# Patient Record
Sex: Male | Born: 1990 | Race: Black or African American | Hispanic: No | Marital: Single | Smoking: Never smoker
Health system: Southern US, Community
[De-identification: ages and names within clinical notes are randomized; demographics above are authoritative.]

## PROBLEM LIST (undated history)

## (undated) HISTORY — PX: JOINT REPLACEMENT: SHX530

---

## 2013-02-02 ENCOUNTER — Emergency Department (INDEPENDENT_AMBULATORY_CARE_PROVIDER_SITE_OTHER)
Admission: EM | Admit: 2013-02-02 | Discharge: 2013-02-02 | Disposition: A | Discharge status: Home / Self Care | Payer: Self-pay | Source: Home / Self Care | Attending: Emergency Medicine | Admitting: Emergency Medicine

## 2013-02-02 ENCOUNTER — Emergency Department (HOSPITAL_COMMUNITY)
Admission: EM | Admit: 2013-02-02 | Discharge: 2013-02-02 | Disposition: A | Discharge status: Home / Self Care | Payer: Self-pay | Attending: Emergency Medicine | Admitting: Emergency Medicine

## 2013-02-02 ENCOUNTER — Encounter (HOSPITAL_COMMUNITY): Payer: Self-pay | Admitting: *Deleted

## 2013-02-02 DIAGNOSIS — F17200 Nicotine dependence, unspecified, uncomplicated: Secondary | ICD-10-CM | POA: Insufficient documentation

## 2013-02-02 DIAGNOSIS — J029 Acute pharyngitis, unspecified: Secondary | ICD-10-CM | POA: Insufficient documentation

## 2013-02-02 DIAGNOSIS — R197 Diarrhea, unspecified: Secondary | ICD-10-CM | POA: Insufficient documentation

## 2013-02-02 DIAGNOSIS — IMO0001 Reserved for inherently not codable concepts without codable children: Secondary | ICD-10-CM | POA: Insufficient documentation

## 2013-02-02 DIAGNOSIS — M2550 Pain in unspecified joint: Secondary | ICD-10-CM

## 2013-02-02 DIAGNOSIS — B9789 Other viral agents as the cause of diseases classified elsewhere: Secondary | ICD-10-CM | POA: Insufficient documentation

## 2013-02-02 DIAGNOSIS — R61 Generalized hyperhidrosis: Secondary | ICD-10-CM | POA: Insufficient documentation

## 2013-02-02 DIAGNOSIS — R52 Pain, unspecified: Secondary | ICD-10-CM | POA: Insufficient documentation

## 2013-02-02 DIAGNOSIS — B349 Viral infection, unspecified: Secondary | ICD-10-CM

## 2013-02-02 DIAGNOSIS — R509 Fever, unspecified: Secondary | ICD-10-CM | POA: Insufficient documentation

## 2013-02-02 LAB — CBC WITH DIFFERENTIAL/PLATELET
Basophils Absolute: 0 10*3/uL (ref 0.0–0.1)
Basophils Relative: 0 % (ref 0–1)
Hemoglobin: 12.8 g/dL — ABNORMAL LOW (ref 13.0–17.0)
MCHC: 34.7 g/dL (ref 30.0–36.0)
Neutro Abs: 11.7 10*3/uL — ABNORMAL HIGH (ref 1.7–7.7)
Neutrophils Relative %: 87 % — ABNORMAL HIGH (ref 43–77)
RDW: 13.8 % (ref 11.5–15.5)
WBC: 13.4 10*3/uL — ABNORMAL HIGH (ref 4.0–10.5)

## 2013-02-02 LAB — URINE MICROSCOPIC-ADD ON

## 2013-02-02 LAB — POCT I-STAT, CHEM 8
Chloride: 99 mEq/L (ref 96–112)
HCT: 42 % (ref 39.0–52.0)
Potassium: 4.1 mEq/L (ref 3.5–5.1)

## 2013-02-02 LAB — URINALYSIS, ROUTINE W REFLEX MICROSCOPIC
Nitrite: NEGATIVE
Specific Gravity, Urine: 1.037 — ABNORMAL HIGH (ref 1.005–1.030)
pH: 5.5 (ref 5.0–8.0)

## 2013-02-02 MED ORDER — IBUPROFEN 800 MG PO TABS
800.0000 mg | ORAL_TABLET | Freq: Once | ORAL | Status: AC
  Administered 2013-02-02: 800 mg via ORAL

## 2013-02-02 MED ORDER — IBUPROFEN 800 MG PO TABS
ORAL_TABLET | ORAL | Status: AC
  Filled 2013-02-02: qty 1

## 2013-02-02 MED ORDER — IBUPROFEN 800 MG PO TABS: 800.0000 mg | ORAL_TABLET | Freq: Once | ORAL | Status: DC

## 2013-02-02 NOTE — ED Notes (Signed)
Pt  Reports    He  Has  fever  Body  Aches       Weakness   X  4  Days     He    Reports  Legs  Feels  Weak     He  Reports  Sorethroat as  Well     He  Is  Awake  And  Alert     His  Friend  Says this  Is  Not  Typical  Of  Him and  Something is  wrong

## 2013-02-02 NOTE — ED Notes (Signed)
Pt alert and mentating appropriately upon d/c. Pt given d/c teaching and follow up care instructions. NAD noted upon d/c. Pt verbalizes understanding and has no further questions upon d/c. Pt ambulatory upon d/c. Pt leaving with family.

## 2013-02-02 NOTE — ED Provider Notes (Addendum)
History    CSN: [id number] Arrival date & time 02/02/13  1139  First MD Initiated Contact with Patient 02/02/13 1233     Chief Complaint  Patient presents with  . Generalized Body Aches   (Consider location/radiation/quality/duration/timing/severity/associated sxs/prior Treatment) HPI Comments: Patient presents urgent care describing for the last 4 days he has been feeling "sore all over". Patient describes having fevers at home and chills. Initially had a sore throat. He is wondering did the day before he got sick he was actually walking in the rain for almost an hour got drenched in sweat.  Patient denies any respiratory symptoms such as cough, congested nose or shortness of breath.  At this point patient is describing that he is unable to walk because of pain on all of his joints including ankles knees his hip area helpful wrist. Does not feel good been having headaches as well.   Patient denies any paresthesias to his upper lower extremity such as tingling or numbness sensation.  The history is provided by the patient.   History reviewed. No pertinent past medical history. History reviewed. No pertinent past surgical history. No family history on file. History  Substance Use Topics  . Smoking status: Current Every Day Smoker  . Smokeless tobacco: Not on file  . Alcohol Use: Yes    Review of Systems  Constitutional: Positive for fever, chills, activity change, appetite change and fatigue. Negative for unexpected weight change.  HENT: Negative for ear pain, congestion, rhinorrhea, neck pain and neck stiffness.   Respiratory: Negative for shortness of breath.   Musculoskeletal: Positive for myalgias, back pain, arthralgias and gait problem. Negative for joint swelling.  Skin: Negative for color change, pallor, rash and wound.  Neurological: Negative for dizziness, weakness and numbness.  Hematological: Negative for adenopathy. Does not bruise/bleed easily.    Allergies   Review of patient's allergies indicates no known allergies.  Home Medications   Current Outpatient Rx  Name  Route  Sig  Dispense  Refill  . Ibuprofen (MOTRIN PO)   Oral   Take by mouth.          BP 119/43  Pulse 96  Temp(Src) 102.6 F (39.2 C) (Oral)  Resp 19  SpO2 99% Physical Exam  Nursing note reviewed. Constitutional: Vital signs are normal. He appears well-developed and well-nourished.  Non-toxic appearance. He does not have a sickly appearance. He appears ill. No distress.  HENT:  Head: Normocephalic.  Eyes: Conjunctivae are normal. Right eye exhibits no discharge. No scleral icterus.  Neck: Neck supple. No JVD present.  Pulmonary/Chest: Effort normal and breath sounds normal.  Abdominal: Soft. Normal appearance. There is no hepatosplenomegaly, splenomegaly or hepatomegaly. There is no tenderness. There is no rigidity, no rebound, no guarding, no CVA tenderness, no tenderness at McBurney's point and negative Murphy's sign. No hernia. Hernia confirmed negative in the ventral area.  Musculoskeletal: He exhibits no edema and no tenderness.  Neurological: He is alert. He displays no tremor. No cranial nerve deficit or sensory deficit. He exhibits normal muscle tone.  Muscular strength lower extreme ties seem to be conserved and within normal-      Skin: No rash noted. No erythema.    ED Course  Procedures (including critical care time) Labs Reviewed  POCT I-STAT, CHEM 8 - Abnormal; Notable for the following:    Glucose, Bld 111 (*)    All other components within normal limits  CBC WITH DIFFERENTIAL   No results found. No diagnosis found.  MDM  Polyarthralgia- febrile syndrome.  Non- ambulatory patient- presents to urgent care unable to walk secondary to pain and discomfort. Patient- sick appearance consistently febrile. We initiated workup with an i-STAT, CBC, patient denies any respiratory symptoms to suggest a active respiratory process.   Patient will  be transferred to the emergency department for further monitoring, observation and evaluation.  Among the differential, includes Guillain Barre Syndrome-  Electrolytes within normal values- normal renal function CBC pending Jimmie Molly, MD 02/02/13 1302 liters  Jimmie Molly, MD 02/02/13 [phone]

## 2013-02-02 NOTE — ED Notes (Signed)
Pt was sent down from urgent care with joint pain and fever from [facility].  Pt was medicated for fever at [facility].  Pt has sorethroat and they swabbed throat at [facility]

## 2013-02-02 NOTE — ED Provider Notes (Signed)
History    CSN: [id number] Arrival date & time 02/02/13  1320  First MD Initiated Contact with Patient 02/02/13 1330     Chief Complaint  Patient presents with  . Fever   (Consider location/radiation/quality/duration/timing/severity/associated sxs/prior Treatment) HPI Comments: Patient a 22 year old male with no significant past medical history who presents for myalgias times or days. Patient states he woke up 4 days ago feeling sore in all of his joints including his shoulders, elbows, lower back and hips, and bilateral knees and ankles. Symptom aggravated when ambulating or with movement. Patient denies taking his temperature at home, but states that he has been having diaphoresis quite frequently; waking up drenched in sweat. He further admits to associated sore throat and watery, nonbloody diarrhea. He denies eye pain/redness, ear discharge, inability to swallow, drooling, cough, SOB, CP, abdominal pain, N/V, urinary symptoms, numbness/tingling, and an inability to ambulate. Denies recent tick bites or rashes.  The history is provided by the patient. No language interpreter was used.   History reviewed. No pertinent past medical history. History reviewed. No pertinent past surgical history. No family history on file. History  Substance Use Topics  . Smoking status: Current Every Day Smoker  . Smokeless tobacco: Not on file  . Alcohol Use: Yes    Review of Systems  Constitutional: Positive for diaphoresis.  Respiratory: Negative for cough and shortness of breath.   Cardiovascular: Negative for chest pain.  Gastrointestinal: Positive for diarrhea. Negative for abdominal pain and blood in stool.  Genitourinary: Negative for dysuria and hematuria.  Musculoskeletal: Positive for myalgias. Negative for joint swelling.  Skin: Negative for rash.  Neurological: Negative for syncope and numbness.  All other systems reviewed and are negative.    Allergies  Review of patient's  allergies indicates no known allergies.  Home Medications   Current Outpatient Rx  Name  Route  Sig  Dispense  Refill  . Diphenhydramine-APAP, sleep, (TYLENOL PM EXTRA STRENGTH PO)   Oral   Take 1 tablet by mouth at bedtime as needed. For sleep         . ibuprofen (ADVIL,MOTRIN) 200 MG tablet   Oral   Take 200 mg by mouth every 6 (six) hours as needed for pain.          BP 132/64  Pulse 95  Temp(Src) 98.5 F (36.9 C) (Oral)  Resp 22  SpO2 96% Physical Exam  Nursing note and vitals reviewed. Constitutional: He is oriented to person, place, and time. He appears well-developed and well-nourished. No distress.  HENT:  Head: Normocephalic and atraumatic.  Mouth/Throat: Oropharynx is clear and moist. No oropharyngeal exudate.  Negative Brudzinski's sign; no nuchal rigidity  Eyes: Conjunctivae and EOM are normal. Pupils are equal, round, and reactive to light. No scleral icterus.  Neck: Normal range of motion.  Cardiovascular: Normal rate, regular rhythm and normal heart sounds.   Pulmonary/Chest: Effort normal and breath sounds normal. No respiratory distress. He has no wheezes. He has no rales.  Abdominal: Soft. He exhibits no distension. There is no tenderness. There is no rebound and no guarding.  Musculoskeletal: Normal range of motion.  Neurological: He is alert and oriented to person, place, and time. He has normal strength. No sensory deficit. He exhibits normal muscle tone. GCS eye subscore is 4. GCS verbal subscore is 5. GCS motor subscore is 6.  Reflex Scores:      Patellar reflexes are 2+ on the right side and 2+ on the left side.  Achilles reflexes are 2+ on the right side. Ambulatory with normal gait.  Skin: Skin is warm and dry. No rash noted. He is not diaphoretic. No erythema. No pallor.  Psychiatric: He has a normal mood and affect. His behavior is normal.    ED Course  Procedures (including critical care time) Labs Reviewed  URINALYSIS, ROUTINE W  REFLEX MICROSCOPIC - Abnormal; Notable for the following:    Color, Urine AMBER (*)    APPearance CLOUDY (*)    Specific Gravity, Urine 1.037 (*)    Hgb urine dipstick SMALL (*)    Bilirubin Urine SMALL (*)    Ketones, ur 15 (*)    Protein, ur 100 (*)    Leukocytes, UA TRACE (*)    All other components within normal limits  URINE MICROSCOPIC-ADD ON - Abnormal; Notable for the following:    Squamous Epithelial / LPF FEW (*)    All other components within normal limits  CULTURE, GROUP A STREP   No results found.  1. Fever   2. Viral illness    MDM  Patient is a 22 year old male who presents from urgent care for fevers and generalized body aches. Chemistry completed at urgent care without any significant findings; electrolytes without imbalance and kidney function preserved. CBC also done at this time which was significant for leukocytosis of 13.4. H&H stable. Rapid strep screen negative. Patient sent over for further workup and monitoring; concern for Guillian Barre syndrome expressed in urgent care note. I, personally, have very low suspicion for GB as patient with no loss of sensation in his b/l extremities or decreased patellar or achilles reflexes; 5/5 strength against resistance of b/l extremities and patient ambulatory in ED without difficulty or assistance. Will check UA for infection. Doubt pneumonia or cardiopulmonary etiology as patient without tachycardia, tachypnea, dyspnea, or hypoxia. Lungs CTAB.  UA negative for evidence of infection. Patient is well and nontoxic appearing, in NAD. Temp down to 98.5 in ED after receiving ibuprofen at urgent care. Suspect that symptoms are due to a viral illness and do not believe further work up is necessary at this time. Patient stable for d/c with PCP follow up; resource guide given. Ibuprofen or tylenol advised for fever control. Return precautions provided and patient agreeable to plan.     Antony Madura, PA-C 02/02/13 1528

## 2013-02-02 NOTE — ED Notes (Signed)
Pt states "they gave me that ibuprofen at [facility] and I feel a lot better." NAD noted. Pt alert and mentating appropriately.

## 2013-02-03 LAB — POCT RAPID STREP A: Streptococcus, Group A Screen (Direct): NEGATIVE

## 2013-02-03 NOTE — ED Provider Notes (Signed)
Medical screening examination/treatment/procedure(s) were conducted as a shared visit with non-physician practitioner(s) and myself.  I personally evaluated the patient during the encounter Pt c/o fever and sore throat, also c/o body aches.  Sore throat now improving. Pt looks well, tolerating po.   Suzi Roots, MD 02/03/13 [phone]

## 2013-02-05 LAB — CULTURE, GROUP A STREP

## 2013-05-31 ENCOUNTER — Emergency Department (HOSPITAL_COMMUNITY)
Admission: EM | Admit: 2013-05-31 | Discharge: 2013-05-31 | Disposition: A | Discharge status: Home / Self Care | Payer: Self-pay | Attending: Emergency Medicine | Admitting: Emergency Medicine

## 2013-05-31 ENCOUNTER — Encounter (HOSPITAL_COMMUNITY): Payer: Self-pay | Admitting: Emergency Medicine

## 2013-05-31 DIAGNOSIS — K047 Periapical abscess without sinus: Secondary | ICD-10-CM | POA: Insufficient documentation

## 2013-05-31 DIAGNOSIS — F17200 Nicotine dependence, unspecified, uncomplicated: Secondary | ICD-10-CM | POA: Insufficient documentation

## 2013-05-31 MED ORDER — OXYCODONE-ACETAMINOPHEN 5-325 MG PO TABS
2.0000 | ORAL_TABLET | Freq: Once | ORAL | Status: DC
  Filled 2013-05-31: qty 2

## 2013-05-31 MED ORDER — IBUPROFEN 400 MG PO TABS
800.0000 mg | ORAL_TABLET | Freq: Once | ORAL | Status: AC
  Administered 2013-05-31: 800 mg via ORAL
  Filled 2013-05-31: qty 2

## 2013-05-31 MED ORDER — PENICILLIN V POTASSIUM 500 MG PO TABS: 500.0000 mg | ORAL_TABLET | Freq: Four times a day (QID) | ORAL | Status: AC

## 2013-05-31 MED ORDER — OXYCODONE-ACETAMINOPHEN 5-325 MG PO TABS: 2.0000 | ORAL_TABLET | ORAL | Status: DC | PRN

## 2013-05-31 NOTE — ED Provider Notes (Signed)
Medical screening examination/treatment/procedure(s) were performed by non-physician practitioner and as supervising physician I was immediately available for consultation/collaboration.  Nichlos Kunzler L Haedyn Breau, MD 05/31/13 1629 

## 2013-05-31 NOTE — ED Provider Notes (Signed)
CSN: [id number]     Arrival date & time 05/31/13  1045 History  This chart was scribed for non-physician practitioner  Irish Elders, FNP, working with Flint Melter, MD, by Yevette Edwards, ED Scribe. This patient was seen in room TR06C/TR06C and the patient's care was started at 12:17 PM.   First MD Initiated Contact with Patient 05/31/13 1217     Chief Complaint  Patient presents with  . Dental Pain    The history is provided by the patient. No language interpreter was used.   HPI Comments: Paul Bender is a 22 y.o. male who presents to the Emergency Department complaining of left-sided, lower dental pain which began yesterday evening and is associated with swollen gums and a subjective fever. He reports increased sensitivity to cold. The pt denies any tenderness to his jaw. The pt has used Tylenol extra strength to mitigate the pain, but without resolution. He is a current smoker.   History reviewed. No pertinent past medical history. History reviewed. No pertinent past surgical history. History reviewed. No pertinent family history. History  Substance Use Topics  . Smoking status: Current Every Day Smoker  . Smokeless tobacco: Not on file  . Alcohol Use: Yes    Review of Systems  Constitutional: Positive for fever.  HENT: Positive for dental problem.        Swelling to gums.     Allergies  Review of patient's allergies indicates no known allergies.  Home Medications   Current Outpatient Rx  Name  Route  Sig  Dispense  Refill  . acetaminophen (TYLENOL) 500 MG tablet   Oral   Take 500 mg by mouth every 6 (six) hours as needed for mild pain or moderate pain.          Triage Vitals: BP 131/85  Pulse 82  Temp(Src) 99 F (37.2 C) (Oral)  Resp 16  Ht 6\' 1"  (1.854 m)  Wt 171 lb (77.565 kg)  BMI 22.57 kg/m2  SpO2 97%  Physical Exam  Nursing note and vitals reviewed. Constitutional: He is oriented to person, place, and time. He appears well-developed and  well-nourished. No distress.  HENT:  Head: Normocephalic and atraumatic.  Mouth/Throat:    No external swelling to jaw or neck.   Eyes: EOM are normal.  Neck: Neck supple. No tracheal deviation present.  Cardiovascular: Normal rate, regular rhythm and normal heart sounds.   No murmur heard. Pulmonary/Chest: Effort normal and breath sounds normal. No respiratory distress. He has no wheezes.  Musculoskeletal: Normal range of motion.  Neurological: He is alert and oriented to person, place, and time.  Skin: Skin is warm and dry.  Psychiatric: He has a normal mood and affect. His behavior is normal.    ED Course  Procedures (including critical care time)  DIAGNOSTIC STUDIES: Oxygen Saturation is 97% on room air, normal by my interpretation.    COORDINATION OF CARE:  12:21 PM- Discussed treatment plan with patient which includes an antibiotic and pain medication, and the patient agreed to the plan. Also informed the pt he needed to follow-up with a dentist.   Labs Review Labs Reviewed - No data to display Imaging Review No results found.  EKG Interpretation   None       MDM   1. Tooth abscess    Tooth broken, abscess. Pain control and Pen VK given and follow-up with dentist. No fever, chills or neck swelling. Eating and drinking fine.   I personally performed the services  described in this documentation, which was scribed in my presence. The recorded information has been reviewed and is accurate.     Irish Elders, NP 05/31/13 1550

## 2013-05-31 NOTE — ED Notes (Signed)
Pt stated he drove to ED.  PA to can cancel oxycodone-acetaminophen order and order IBU.

## 2013-05-31 NOTE — ED Notes (Signed)
States he has a cavity on bottom L tooth and it started to hurt last night

## 2019-12-03 ENCOUNTER — Encounter (HOSPITAL_COMMUNITY)
Admission: EM | Disposition: A | Discharge status: Other Acute Inpatient Hospital | Payer: Self-pay | Source: Home / Self Care

## 2019-12-03 ENCOUNTER — Emergency Department (HOSPITAL_COMMUNITY): Payer: No Typology Code available for payment source

## 2019-12-03 ENCOUNTER — Other Ambulatory Visit (HOSPITAL_COMMUNITY): Payer: Self-pay

## 2019-12-03 ENCOUNTER — Inpatient Hospital Stay (HOSPITAL_COMMUNITY): Payer: No Typology Code available for payment source | Admitting: Anesthesiology

## 2019-12-03 ENCOUNTER — Inpatient Hospital Stay (HOSPITAL_COMMUNITY): Payer: No Typology Code available for payment source

## 2019-12-03 ENCOUNTER — Inpatient Hospital Stay (HOSPITAL_COMMUNITY)
Admission: EM | Admit: 2019-12-03 | Discharge: 2020-01-09 | DRG: 957 | Disposition: A | Discharge status: Other Acute Inpatient Hospital | Payer: No Typology Code available for payment source | Attending: General Surgery | Admitting: General Surgery

## 2019-12-03 ENCOUNTER — Emergency Department (HOSPITAL_COMMUNITY): Payer: No Typology Code available for payment source | Admitting: Certified Registered Nurse Anesthetist

## 2019-12-03 ENCOUNTER — Other Ambulatory Visit: Payer: Self-pay

## 2019-12-03 ENCOUNTER — Encounter (HOSPITAL_COMMUNITY): Payer: Self-pay | Admitting: Emergency Medicine

## 2019-12-03 DIAGNOSIS — S0292XA Unspecified fracture of facial bones, initial encounter for closed fracture: Secondary | ICD-10-CM

## 2019-12-03 DIAGNOSIS — I10 Essential (primary) hypertension: Secondary | ICD-10-CM | POA: Diagnosis not present

## 2019-12-03 DIAGNOSIS — Y9301 Activity, walking, marching and hiking: Secondary | ICD-10-CM | POA: Diagnosis present

## 2019-12-03 DIAGNOSIS — S42131A Displaced fracture of coracoid process, right shoulder, initial encounter for closed fracture: Secondary | ICD-10-CM | POA: Diagnosis present

## 2019-12-03 DIAGNOSIS — S0231XA Fracture of orbital floor, right side, initial encounter for closed fracture: Secondary | ICD-10-CM | POA: Diagnosis present

## 2019-12-03 DIAGNOSIS — S02831A Fracture of medial orbital wall, right side, initial encounter for closed fracture: Secondary | ICD-10-CM | POA: Diagnosis present

## 2019-12-03 DIAGNOSIS — S32401A Unspecified fracture of right acetabulum, initial encounter for closed fracture: Secondary | ICD-10-CM

## 2019-12-03 DIAGNOSIS — S02651A Fracture of angle of right mandible, initial encounter for closed fracture: Secondary | ICD-10-CM | POA: Diagnosis present

## 2019-12-03 DIAGNOSIS — Z9911 Dependence on respirator [ventilator] status: Secondary | ICD-10-CM | POA: Diagnosis not present

## 2019-12-03 DIAGNOSIS — J9690 Respiratory failure, unspecified, unspecified whether with hypoxia or hypercapnia: Secondary | ICD-10-CM

## 2019-12-03 DIAGNOSIS — S82832C Other fracture of upper and lower end of left fibula, initial encounter for open fracture type IIIA, IIIB, or IIIC: Secondary | ICD-10-CM | POA: Diagnosis present

## 2019-12-03 DIAGNOSIS — S022XXA Fracture of nasal bones, initial encounter for closed fracture: Secondary | ICD-10-CM | POA: Diagnosis present

## 2019-12-03 DIAGNOSIS — S02641A Fracture of ramus of right mandible, initial encounter for closed fracture: Secondary | ICD-10-CM | POA: Diagnosis present

## 2019-12-03 DIAGNOSIS — T148XXA Other injury of unspecified body region, initial encounter: Secondary | ICD-10-CM

## 2019-12-03 DIAGNOSIS — S0281XA Fracture of other specified skull and facial bones, right side, initial encounter for closed fracture: Secondary | ICD-10-CM

## 2019-12-03 DIAGNOSIS — S358X8A Other specified injury of other blood vessels at abdomen, lower back and pelvis level, initial encounter: Secondary | ICD-10-CM | POA: Diagnosis present

## 2019-12-03 DIAGNOSIS — S32461A Displaced associated transverse-posterior fracture of right acetabulum, initial encounter for closed fracture: Secondary | ICD-10-CM

## 2019-12-03 DIAGNOSIS — Y92410 Unspecified street and highway as the place of occurrence of the external cause: Secondary | ICD-10-CM | POA: Diagnosis not present

## 2019-12-03 DIAGNOSIS — Z20822 Contact with and (suspected) exposure to covid-19: Secondary | ICD-10-CM | POA: Diagnosis present

## 2019-12-03 DIAGNOSIS — R509 Fever, unspecified: Secondary | ICD-10-CM | POA: Diagnosis not present

## 2019-12-03 DIAGNOSIS — Q899 Congenital malformation, unspecified: Secondary | ICD-10-CM

## 2019-12-03 DIAGNOSIS — S82102C Unspecified fracture of upper end of left tibia, initial encounter for open fracture type IIIA, IIIB, or IIIC: Secondary | ICD-10-CM | POA: Diagnosis present

## 2019-12-03 DIAGNOSIS — J9601 Acute respiratory failure with hypoxia: Secondary | ICD-10-CM | POA: Diagnosis present

## 2019-12-03 DIAGNOSIS — S32421A Displaced fracture of posterior wall of right acetabulum, initial encounter for closed fracture: Secondary | ICD-10-CM | POA: Diagnosis present

## 2019-12-03 DIAGNOSIS — S32591A Other specified fracture of right pubis, initial encounter for closed fracture: Secondary | ICD-10-CM | POA: Diagnosis present

## 2019-12-03 DIAGNOSIS — E876 Hypokalemia: Secondary | ICD-10-CM | POA: Diagnosis not present

## 2019-12-03 DIAGNOSIS — D62 Acute posthemorrhagic anemia: Secondary | ICD-10-CM | POA: Diagnosis not present

## 2019-12-03 DIAGNOSIS — F23 Brief psychotic disorder: Secondary | ICD-10-CM | POA: Diagnosis not present

## 2019-12-03 DIAGNOSIS — Z4659 Encounter for fitting and adjustment of other gastrointestinal appliance and device: Secondary | ICD-10-CM

## 2019-12-03 DIAGNOSIS — S62232A Other displaced fracture of base of first metacarpal bone, left hand, initial encounter for closed fracture: Secondary | ICD-10-CM | POA: Diagnosis present

## 2019-12-03 DIAGNOSIS — T84218A Breakdown (mechanical) of internal fixation device of other bones, initial encounter: Secondary | ICD-10-CM | POA: Diagnosis not present

## 2019-12-03 DIAGNOSIS — Z781 Physical restraint status: Secondary | ICD-10-CM

## 2019-12-03 DIAGNOSIS — I4581 Long QT syndrome: Secondary | ICD-10-CM | POA: Diagnosis not present

## 2019-12-03 DIAGNOSIS — D696 Thrombocytopenia, unspecified: Secondary | ICD-10-CM | POA: Diagnosis not present

## 2019-12-03 DIAGNOSIS — F332 Major depressive disorder, recurrent severe without psychotic features: Secondary | ICD-10-CM | POA: Diagnosis present

## 2019-12-03 DIAGNOSIS — R000 Tachycardia, unspecified: Secondary | ICD-10-CM | POA: Diagnosis not present

## 2019-12-03 DIAGNOSIS — I9742 Intraoperative hemorrhage and hematoma of a circulatory system organ or structure complicating other procedure: Secondary | ICD-10-CM | POA: Diagnosis not present

## 2019-12-03 DIAGNOSIS — R609 Edema, unspecified: Secondary | ICD-10-CM | POA: Diagnosis not present

## 2019-12-03 DIAGNOSIS — S82209A Unspecified fracture of shaft of unspecified tibia, initial encounter for closed fracture: Secondary | ICD-10-CM

## 2019-12-03 DIAGNOSIS — S0101XA Laceration without foreign body of scalp, initial encounter: Secondary | ICD-10-CM | POA: Diagnosis present

## 2019-12-03 DIAGNOSIS — R937 Abnormal findings on diagnostic imaging of other parts of musculoskeletal system: Secondary | ICD-10-CM | POA: Diagnosis present

## 2019-12-03 DIAGNOSIS — S32471A Displaced fracture of medial wall of right acetabulum, initial encounter for closed fracture: Secondary | ICD-10-CM | POA: Diagnosis present

## 2019-12-03 DIAGNOSIS — S32441A Displaced fracture of posterior column [ilioischial] of right acetabulum, initial encounter for closed fracture: Secondary | ICD-10-CM | POA: Diagnosis present

## 2019-12-03 DIAGNOSIS — Y828 Other medical devices associated with adverse incidents: Secondary | ICD-10-CM | POA: Diagnosis not present

## 2019-12-03 DIAGNOSIS — Z9119 Patient's noncompliance with other medical treatment and regimen: Secondary | ICD-10-CM

## 2019-12-03 DIAGNOSIS — R600 Localized edema: Secondary | ICD-10-CM | POA: Diagnosis not present

## 2019-12-03 DIAGNOSIS — I9589 Other hypotension: Secondary | ICD-10-CM | POA: Diagnosis present

## 2019-12-03 DIAGNOSIS — S3730XA Unspecified injury of urethra, initial encounter: Secondary | ICD-10-CM

## 2019-12-03 DIAGNOSIS — S82262C Displaced segmental fracture of shaft of left tibia, initial encounter for open fracture type IIIA, IIIB, or IIIC: Secondary | ICD-10-CM

## 2019-12-03 DIAGNOSIS — M79645 Pain in left finger(s): Secondary | ICD-10-CM

## 2019-12-03 DIAGNOSIS — N5089 Other specified disorders of the male genital organs: Secondary | ICD-10-CM | POA: Diagnosis present

## 2019-12-03 DIAGNOSIS — S86222A Laceration of muscle(s) and tendon(s) of anterior muscle group at lower leg level, left leg, initial encounter: Secondary | ICD-10-CM | POA: Diagnosis present

## 2019-12-03 DIAGNOSIS — S82252C Displaced comminuted fracture of shaft of left tibia, initial encounter for open fracture type IIIA, IIIB, or IIIC: Secondary | ICD-10-CM

## 2019-12-03 DIAGNOSIS — R110 Nausea: Secondary | ICD-10-CM | POA: Diagnosis not present

## 2019-12-03 DIAGNOSIS — T1490XA Injury, unspecified, initial encounter: Secondary | ICD-10-CM

## 2019-12-03 DIAGNOSIS — S300XXA Contusion of lower back and pelvis, initial encounter: Secondary | ICD-10-CM | POA: Diagnosis present

## 2019-12-03 DIAGNOSIS — S329XXA Fracture of unspecified parts of lumbosacral spine and pelvis, initial encounter for closed fracture: Secondary | ICD-10-CM

## 2019-12-03 DIAGNOSIS — Z419 Encounter for procedure for purposes other than remedying health state, unspecified: Secondary | ICD-10-CM

## 2019-12-03 DIAGNOSIS — R319 Hematuria, unspecified: Secondary | ICD-10-CM

## 2019-12-03 DIAGNOSIS — R7402 Elevation of levels of lactic acid dehydrogenase (LDH): Secondary | ICD-10-CM | POA: Diagnosis present

## 2019-12-03 DIAGNOSIS — S0081XA Abrasion of other part of head, initial encounter: Secondary | ICD-10-CM | POA: Diagnosis present

## 2019-12-03 DIAGNOSIS — S82452C Displaced comminuted fracture of shaft of left fibula, initial encounter for open fracture type IIIA, IIIB, or IIIC: Secondary | ICD-10-CM

## 2019-12-03 DIAGNOSIS — T50995A Adverse effect of other drugs, medicaments and biological substances, initial encounter: Secondary | ICD-10-CM | POA: Diagnosis not present

## 2019-12-03 DIAGNOSIS — S32409A Unspecified fracture of unspecified acetabulum, initial encounter for closed fracture: Secondary | ICD-10-CM

## 2019-12-03 DIAGNOSIS — S60512A Abrasion of left hand, initial encounter: Secondary | ICD-10-CM | POA: Diagnosis present

## 2019-12-03 DIAGNOSIS — S60511A Abrasion of right hand, initial encounter: Secondary | ICD-10-CM | POA: Diagnosis present

## 2019-12-03 HISTORY — PX: IR ANGIOGRAM SELECTIVE EACH ADDITIONAL VESSEL: IMG667

## 2019-12-03 HISTORY — PX: IR US GUIDE VASC ACCESS LEFT: IMG2389

## 2019-12-03 HISTORY — PX: IR EMBO ART  VEN HEMORR LYMPH EXTRAV  INC GUIDE ROADMAPPING: IMG5450

## 2019-12-03 HISTORY — PX: IR ANGIOGRAM PELVIS SELECTIVE OR SUPRASELECTIVE: IMG661

## 2019-12-03 HISTORY — PX: TIBIA IM NAIL INSERTION: SHX2516

## 2019-12-03 HISTORY — PX: I & D EXTREMITY: SHX5045

## 2019-12-03 HISTORY — PX: RADIOLOGY WITH ANESTHESIA: SHX6223

## 2019-12-03 HISTORY — PX: ORIF MANDIBULAR FRACTURE: SHX2127

## 2019-12-03 HISTORY — PX: INSERTION OF TRACTION PIN: SHX6560

## 2019-12-03 HISTORY — PX: SCALP LACERATION REPAIR: SHX6089

## 2019-12-03 LAB — URINALYSIS, ROUTINE W REFLEX MICROSCOPIC
Bacteria, UA: NONE SEEN
Bilirubin Urine: NEGATIVE
Glucose, UA: NEGATIVE mg/dL
Ketones, ur: NEGATIVE mg/dL
Leukocytes,Ua: NEGATIVE
Nitrite: NEGATIVE
Protein, ur: 30 mg/dL — AB
RBC / HPF: >50 RBC/hpf — ABNORMAL HIGH (ref 0–5)
Specific Gravity, Urine: >1.046 — ABNORMAL HIGH (ref 1.005–1.030)
pH: 7 (ref 5.0–8.0)

## 2019-12-03 LAB — POCT I-STAT 7, (LYTES, BLD GAS, ICA,H+H)
Acid-Base Excess: 1 mmol/L (ref 0.0–2.0)
Acid-base deficit: 1 mmol/L (ref 0.0–2.0)
Acid-base deficit: 1 mmol/L (ref 0.0–2.0)
Acid-base deficit: 2 mmol/L (ref 0.0–2.0)
Bicarbonate: 22.7 mmol/L (ref 20.0–28.0)
Bicarbonate: 23 mmol/L (ref 20.0–28.0)
Bicarbonate: 23.1 mmol/L (ref 20.0–28.0)
Bicarbonate: 25.8 mmol/L (ref 20.0–28.0)
Calcium, Ion: 1.14 mmol/L — ABNORMAL LOW (ref 1.15–1.40)
Calcium, Ion: 1.15 mmol/L (ref 1.15–1.40)
Calcium, Ion: 1.16 mmol/L (ref 1.15–1.40)
Calcium, Ion: 1.07 mmol/L — ABNORMAL LOW (ref 1.15–1.40)
HCT: 26 % — ABNORMAL LOW (ref 39.0–52.0)
HCT: 23 % — ABNORMAL LOW (ref 39.0–52.0)
HCT: 26 % — ABNORMAL LOW (ref 39.0–52.0)
HCT: 24 % — ABNORMAL LOW (ref 39.0–52.0)
Hemoglobin: 8.8 g/dL — ABNORMAL LOW (ref 13.0–17.0)
Hemoglobin: 7.8 g/dL — ABNORMAL LOW (ref 13.0–17.0)
Hemoglobin: 8.8 g/dL — ABNORMAL LOW (ref 13.0–17.0)
Hemoglobin: 8.2 g/dL — ABNORMAL LOW (ref 13.0–17.0)
O2 Saturation: 100 %
O2 Saturation: 100 %
O2 Saturation: 100 %
O2 Saturation: 100 %
Patient temperature: 35.1
Patient temperature: 34.5
Potassium: 3.2 mmol/L — ABNORMAL LOW (ref 3.5–5.1)
Potassium: 3 mmol/L — ABNORMAL LOW (ref 3.5–5.1)
Potassium: 3.4 mmol/L — ABNORMAL LOW (ref 3.5–5.1)
Potassium: 3.6 mmol/L (ref 3.5–5.1)
Sodium: 136 mmol/L (ref 135–145)
Sodium: 136 mmol/L (ref 135–145)
Sodium: 138 mmol/L (ref 135–145)
Sodium: 138 mmol/L (ref 135–145)
TCO2: 24 mmol/L (ref 22–32)
TCO2: 24 mmol/L (ref 22–32)
TCO2: 24 mmol/L (ref 22–32)
TCO2: 27 mmol/L (ref 22–32)
pCO2 arterial: 31.8 mmHg — ABNORMAL LOW (ref 32.0–48.0)
pCO2 arterial: 32.9 mmHg (ref 32.0–48.0)
pCO2 arterial: 38.3 mmHg (ref 32.0–48.0)
pCO2 arterial: 40.3 mmHg (ref 32.0–48.0)
pH, Arterial: 7.461 — ABNORMAL HIGH (ref 7.350–7.450)
pH, Arterial: 7.444 (ref 7.350–7.450)
pH, Arterial: 7.378 (ref 7.350–7.450)
pH, Arterial: 7.414 (ref 7.350–7.450)
pO2, Arterial: 432 mmHg — ABNORMAL HIGH (ref 83.0–108.0)
pO2, Arterial: 312 mmHg — ABNORMAL HIGH (ref 83.0–108.0)
pO2, Arterial: 276 mmHg — ABNORMAL HIGH (ref 83.0–108.0)
pO2, Arterial: 572 mmHg — ABNORMAL HIGH (ref 83.0–108.0)

## 2019-12-03 LAB — CBC
HCT: 33.1 % — ABNORMAL LOW (ref 39.0–52.0)
HCT: 25.6 % — ABNORMAL LOW (ref 39.0–52.0)
Hemoglobin: 10.9 g/dL — ABNORMAL LOW (ref 13.0–17.0)
Hemoglobin: 8.8 g/dL — ABNORMAL LOW (ref 13.0–17.0)
MCH: 31.7 pg (ref 26.0–34.0)
MCH: 30.6 pg (ref 26.0–34.0)
MCHC: 32.9 g/dL (ref 30.0–36.0)
MCHC: 34.4 g/dL (ref 30.0–36.0)
MCV: 96.2 fL (ref 80.0–100.0)
MCV: 88.9 fL (ref 80.0–100.0)
Platelets: 132 10*3/uL — ABNORMAL LOW (ref 150–400)
Platelets: 65 10*3/uL — ABNORMAL LOW (ref 150–400)
RBC: 3.44 MIL/uL — ABNORMAL LOW (ref 4.22–5.81)
RBC: 2.88 MIL/uL — ABNORMAL LOW (ref 4.22–5.81)
RDW: 14.2 % (ref 11.5–15.5)
RDW: 15.3 % (ref 11.5–15.5)
WBC: 8 10*3/uL (ref 4.0–10.5)
WBC: 7.5 10*3/uL (ref 4.0–10.5)
nRBC: 0 % (ref 0.0–0.2)
nRBC: 0 % (ref 0.0–0.2)

## 2019-12-03 LAB — TRAUMA TEG PANEL
CFF Max Amplitude: 12.8 mm — ABNORMAL LOW (ref 15–32)
Citrated Kaolin (R): 2 min — ABNORMAL LOW (ref 4.6–9.1)
Citrated Rapid TEG (MA): 46.8 mm — ABNORMAL LOW (ref 52–70)
Lysis at 30 Minutes: 0 % (ref 0.0–2.6)

## 2019-12-03 LAB — COMPREHENSIVE METABOLIC PANEL
ALT: 37 U/L (ref 0–44)
ALT: 33 U/L (ref 0–44)
AST: 89 U/L — ABNORMAL HIGH (ref 15–41)
AST: 97 U/L — ABNORMAL HIGH (ref 15–41)
Albumin: 3.2 g/dL — ABNORMAL LOW (ref 3.5–5.0)
Albumin: 2.5 g/dL — ABNORMAL LOW (ref 3.5–5.0)
Alkaline Phosphatase: 45 U/L (ref 38–126)
Alkaline Phosphatase: 26 U/L — ABNORMAL LOW (ref 38–126)
Anion gap: 11 (ref 5–15)
Anion gap: 8 (ref 5–15)
BUN: 12 mg/dL (ref 6–20)
BUN: 12 mg/dL (ref 6–20)
CO2: 21 mmol/L — ABNORMAL LOW (ref 22–32)
CO2: 21 mmol/L — ABNORMAL LOW (ref 22–32)
Calcium: 8.3 mg/dL — ABNORMAL LOW (ref 8.9–10.3)
Calcium: 7.5 mg/dL — ABNORMAL LOW (ref 8.9–10.3)
Chloride: 105 mmol/L (ref 98–111)
Chloride: 108 mmol/L (ref 98–111)
Creatinine, Ser: 1.09 mg/dL (ref 0.61–1.24)
Creatinine, Ser: 0.96 mg/dL (ref 0.61–1.24)
GFR calc Af Amer: >60 mL/min (ref >60)
GFR calc Af Amer: >60 mL/min (ref >60)
GFR calc non Af Amer: >60 mL/min (ref >60)
GFR calc non Af Amer: >60 mL/min (ref >60)
Glucose, Bld: 152 mg/dL — ABNORMAL HIGH (ref 70–99)
Glucose, Bld: 139 mg/dL — ABNORMAL HIGH (ref 70–99)
Potassium: 3.7 mmol/L (ref 3.5–5.1)
Potassium: 3.7 mmol/L (ref 3.5–5.1)
Sodium: 137 mmol/L (ref 135–145)
Sodium: 137 mmol/L (ref 135–145)
Total Bilirubin: 1.1 mg/dL (ref 0.3–1.2)
Total Bilirubin: 1.3 mg/dL — ABNORMAL HIGH (ref 0.3–1.2)
Total Protein: 6 g/dL — ABNORMAL LOW (ref 6.5–8.1)
Total Protein: 4.2 g/dL — ABNORMAL LOW (ref 6.5–8.1)

## 2019-12-03 LAB — RAPID URINE DRUG SCREEN, HOSP PERFORMED
Amphetamines: NOT DETECTED
Barbiturates: NOT DETECTED
Benzodiazepines: NOT DETECTED
Cocaine: NOT DETECTED
Opiates: NOT DETECTED
Tetrahydrocannabinol: POSITIVE — AB

## 2019-12-03 LAB — LACTIC ACID, PLASMA
Lactic Acid, Venous: 2.4 mmol/L (ref 0.5–1.9)
Lactic Acid, Venous: 1.1 mmol/L (ref 0.5–1.9)

## 2019-12-03 LAB — PROTIME-INR
INR: 1 (ref 0.8–1.2)
Prothrombin Time: 13 seconds (ref 11.4–15.2)

## 2019-12-03 LAB — I-STAT CHEM 8, ED
BUN: 14 mg/dL (ref 6–20)
Calcium, Ion: 1.08 mmol/L — ABNORMAL LOW (ref 1.15–1.40)
Chloride: 103 mmol/L (ref 98–111)
Creatinine, Ser: 1 mg/dL (ref 0.61–1.24)
Glucose, Bld: 140 mg/dL — ABNORMAL HIGH (ref 70–99)
HCT: 33 % — ABNORMAL LOW (ref 39.0–52.0)
Hemoglobin: 11.2 g/dL — ABNORMAL LOW (ref 13.0–17.0)
Potassium: 3.6 mmol/L (ref 3.5–5.1)
Sodium: 137 mmol/L (ref 135–145)
TCO2: 22 mmol/L (ref 22–32)

## 2019-12-03 LAB — ETHANOL: Alcohol, Ethyl (B): <10 mg/dL (ref <10)

## 2019-12-03 LAB — HIV ANTIBODY (ROUTINE TESTING W REFLEX): HIV Screen 4th Generation wRfx: NONREACTIVE

## 2019-12-03 LAB — PREPARE RBC (CROSSMATCH)

## 2019-12-03 LAB — ABO/RH: ABO/RH(D): A POS

## 2019-12-03 LAB — SARS CORONAVIRUS 2 BY RT PCR (HOSPITAL ORDER, PERFORMED IN ~~LOC~~ HOSPITAL LAB): SARS Coronavirus 2: NEGATIVE

## 2019-12-03 SURGERY — IR WITH ANESTHESIA
Anesthesia: General

## 2019-12-03 SURGERY — INSERTION, TRACTION PIN
Anesthesia: General | Site: Leg Lower | Laterality: Right

## 2019-12-03 MED ORDER — FENTANYL CITRATE (PF) 100 MCG/2ML IJ SOLN
50.0000 ug | INTRAMUSCULAR | Status: DC | PRN
  Administered 2019-12-04: 50 ug via INTRAVENOUS

## 2019-12-03 MED ORDER — MIDAZOLAM HCL 2 MG/2ML IJ SOLN
INTRAMUSCULAR | Status: AC
  Filled 2019-12-03: qty 2

## 2019-12-03 MED ORDER — FENTANYL 2500MCG IN NS 250ML (10MCG/ML) PREMIX INFUSION
25.0000 ug/h | INTRAVENOUS | Status: DC
  Administered 2019-12-03: 100 ug/h via INTRAVENOUS
  Administered 2019-12-03: 200 ug/h via INTRAVENOUS
  Administered 2019-12-04: 300 ug/h via INTRAVENOUS
  Administered 2019-12-04: 200 ug/h via INTRAVENOUS
  Administered 2019-12-05: 325 ug/h via INTRAVENOUS
  Administered 2019-12-05: 350 ug/h via INTRAVENOUS
  Filled 2019-12-03 (×5): qty 250

## 2019-12-03 MED ORDER — BACITRACIN 500 UNIT/GM EX OINT
TOPICAL_OINTMENT | CUTANEOUS | Status: DC | PRN
  Administered 2019-12-03: 1 via TOPICAL

## 2019-12-03 MED ORDER — ONDANSETRON HCL 4 MG PO TABS: 4.0000 mg | ORAL_TABLET | Freq: Four times a day (QID) | ORAL | Status: DC | PRN

## 2019-12-03 MED ORDER — MORPHINE SULFATE (PF) 4 MG/ML IV SOLN
4.0000 mg | INTRAVENOUS | Status: DC | PRN
  Administered 2019-12-06 – 2019-12-27 (×6): 4 mg via INTRAVENOUS
  Filled 2019-12-03 (×8): qty 1

## 2019-12-03 MED ORDER — FENTANYL BOLUS VIA INFUSION
50.0000 ug | INTRAVENOUS | Status: DC | PRN
  Filled 2019-12-03: qty 50

## 2019-12-03 MED ORDER — MIDAZOLAM HCL 2 MG/2ML IJ SOLN: 2.0000 mg | INTRAMUSCULAR | Status: DC | PRN

## 2019-12-03 MED ORDER — METOCLOPRAMIDE HCL 5 MG/ML IJ SOLN: 5.0000 mg | Freq: Three times a day (TID) | INTRAMUSCULAR | Status: DC | PRN

## 2019-12-03 MED ORDER — TOBRAMYCIN SULFATE 1.2 G IJ SOLR
INTRAMUSCULAR | Status: AC
  Filled 2019-12-03: qty 1.2

## 2019-12-03 MED ORDER — POVIDONE-IODINE 10 % EX SWAB: 2.0000 "application " | Freq: Once | CUTANEOUS | Status: DC

## 2019-12-03 MED ORDER — VANCOMYCIN HCL 1000 MG IV SOLR
INTRAVENOUS | Status: DC | PRN
  Administered 2019-12-03: 1000 mg

## 2019-12-03 MED ORDER — ONDANSETRON HCL 4 MG/2ML IJ SOLN: 4.0000 mg | Freq: Four times a day (QID) | INTRAMUSCULAR | Status: DC | PRN

## 2019-12-03 MED ORDER — ACETAMINOPHEN 500 MG PO TABS
1000.0000 mg | ORAL_TABLET | Freq: Four times a day (QID) | ORAL | Status: DC
  Administered 2019-12-04 – 2019-12-05 (×3): 1000 mg
  Filled 2019-12-03 (×5): qty 2

## 2019-12-03 MED ORDER — CEFAZOLIN SODIUM-DEXTROSE 2-3 GM-%(50ML) IV SOLR
INTRAVENOUS | Status: DC | PRN
  Administered 2019-12-03: 2 g via INTRAVENOUS

## 2019-12-03 MED ORDER — LACTATED RINGERS IV SOLN: INTRAVENOUS | Status: DC

## 2019-12-03 MED ORDER — METOCLOPRAMIDE HCL 5 MG PO TABS
5.0000 mg | ORAL_TABLET | Freq: Three times a day (TID) | ORAL | Status: DC | PRN
  Filled 2019-12-03: qty 2

## 2019-12-03 MED ORDER — PROPOFOL 1000 MG/100ML IV EMUL
0.0000 ug/kg/min | INTRAVENOUS | Status: DC
  Administered 2019-12-03 (×2): 60 ug/kg/min via INTRAVENOUS
  Administered 2019-12-03 – 2019-12-04 (×2): 50 ug/kg/min via INTRAVENOUS
  Administered 2019-12-04: 40 ug/kg/min via INTRAVENOUS
  Administered 2019-12-04: 50 ug/kg/min via INTRAVENOUS
  Administered 2019-12-04: 40 ug/kg/min via INTRAVENOUS
  Administered 2019-12-05: 15 ug/kg/min via INTRAVENOUS
  Filled 2019-12-03 (×6): qty 100

## 2019-12-03 MED ORDER — FENTANYL CITRATE (PF) 100 MCG/2ML IJ SOLN: 50.0000 ug | INTRAMUSCULAR | Status: DC | PRN

## 2019-12-03 MED ORDER — CEFAZOLIN SODIUM-DEXTROSE 1-4 GM/50ML-% IV SOLN
INTRAVENOUS | Status: AC
  Filled 2019-12-03: qty 100

## 2019-12-03 MED ORDER — METHYLENE BLUE 0.5 % INJ SOLN
INTRAVENOUS | Status: AC
  Filled 2019-12-03: qty 10

## 2019-12-03 MED ORDER — FENTANYL CITRATE (PF) 250 MCG/5ML IJ SOLN
INTRAMUSCULAR | Status: AC
  Filled 2019-12-03: qty 5

## 2019-12-03 MED ORDER — KETAMINE HCL 50 MG/5ML IJ SOSY
1.0000 mg/kg | PREFILLED_SYRINGE | Freq: Once | INTRAMUSCULAR | Status: AC
  Administered 2019-12-03: 77 mg via INTRAVENOUS
  Filled 2019-12-03: qty 10

## 2019-12-03 MED ORDER — IOHEXOL 300 MG/ML  SOLN
50.0000 mL | Freq: Once | INTRAMUSCULAR | Status: AC | PRN
  Administered 2019-12-03: 50 mL

## 2019-12-03 MED ORDER — MIDAZOLAM HCL 5 MG/5ML IJ SOLN
INTRAMUSCULAR | Status: DC | PRN
  Administered 2019-12-03: 2 mg via INTRAVENOUS

## 2019-12-03 MED ORDER — ROCURONIUM BROMIDE 10 MG/ML (PF) SYRINGE
PREFILLED_SYRINGE | INTRAVENOUS | Status: DC | PRN
  Administered 2019-12-03 (×2): 50 mg via INTRAVENOUS
  Administered 2019-12-03 (×2): 30 mg via INTRAVENOUS

## 2019-12-03 MED ORDER — GELATIN ABSORBABLE 12-7 MM EX MISC
CUTANEOUS | Status: AC
  Filled 2019-12-03: qty 2

## 2019-12-03 MED ORDER — POTASSIUM CHLORIDE IN NACL 20-0.9 MEQ/L-% IV SOLN
INTRAVENOUS | Status: DC
  Filled 2019-12-03 (×6): qty 1000

## 2019-12-03 MED ORDER — PANTOPRAZOLE SODIUM 40 MG PO TBEC: 40.0000 mg | DELAYED_RELEASE_TABLET | Freq: Every day | ORAL | Status: DC

## 2019-12-03 MED ORDER — CHLORHEXIDINE GLUCONATE 4 % EX LIQD
60.0000 mL | Freq: Once | CUTANEOUS | Status: DC
  Filled 2019-12-03: qty 60

## 2019-12-03 MED ORDER — ALBUMIN HUMAN 5 % IV SOLN: INTRAVENOUS | Status: DC | PRN

## 2019-12-03 MED ORDER — ONDANSETRON HCL 4 MG/2ML IJ SOLN
INTRAMUSCULAR | Status: DC | PRN
  Administered 2019-12-03: 4 mg via INTRAVENOUS

## 2019-12-03 MED ORDER — SODIUM CHLORIDE 0.9 % IV SOLN
INTRAVENOUS | Status: DC | PRN
Start: 2019-12-03 — End: 2019-12-03

## 2019-12-03 MED ORDER — METHOCARBAMOL 1000 MG/10ML IJ SOLN
500.0000 mg | Freq: Four times a day (QID) | INTRAVENOUS | Status: DC | PRN
  Filled 2019-12-03: qty 5

## 2019-12-03 MED ORDER — ROCURONIUM BROMIDE 10 MG/ML (PF) SYRINGE
PREFILLED_SYRINGE | INTRAVENOUS | Status: AC
  Filled 2019-12-03: qty 10

## 2019-12-03 MED ORDER — IOHEXOL 300 MG/ML  SOLN
100.0000 mL | Freq: Once | INTRAMUSCULAR | Status: AC | PRN
  Administered 2019-12-03: 60 mL via INTRA_ARTERIAL

## 2019-12-03 MED ORDER — CEFAZOLIN SODIUM-DEXTROSE 2-4 GM/100ML-% IV SOLN
2.0000 g | INTRAVENOUS | Status: AC
  Administered 2019-12-04 (×2): 2 g via INTRAVENOUS
  Filled 2019-12-03: qty 100

## 2019-12-03 MED ORDER — METHOCARBAMOL 500 MG PO TABS: 500.0000 mg | ORAL_TABLET | Freq: Four times a day (QID) | ORAL | Status: DC | PRN

## 2019-12-03 MED ORDER — POLYETHYLENE GLYCOL 3350 17 G PO PACK: 17.0000 g | PACK | Freq: Every day | ORAL | Status: DC

## 2019-12-03 MED ORDER — METOPROLOL TARTRATE 5 MG/5ML IV SOLN
5.0000 mg | Freq: Four times a day (QID) | INTRAVENOUS | Status: DC | PRN
  Administered 2019-12-08 (×2): 5 mg via INTRAVENOUS
  Filled 2019-12-03 (×2): qty 5

## 2019-12-03 MED ORDER — METHYLENE BLUE 0.5 % INJ SOLN
INTRAVENOUS | Status: DC | PRN
  Administered 2019-12-03: 2 mL

## 2019-12-03 MED ORDER — PHENYLEPHRINE HCL-NACL 10-0.9 MG/250ML-% IV SOLN
INTRAVENOUS | Status: DC | PRN
  Administered 2019-12-03: 100 ug/min via INTRAVENOUS

## 2019-12-03 MED ORDER — VANCOMYCIN HCL 1000 MG IV SOLR
INTRAVENOUS | Status: AC
  Filled 2019-12-03: qty 1000

## 2019-12-03 MED ORDER — TETANUS-DIPHTH-ACELL PERTUSSIS 5-2.5-18.5 LF-MCG/0.5 IM SUSP: 0.5000 mL | Freq: Once | INTRAMUSCULAR | Status: DC

## 2019-12-03 MED ORDER — ROCURONIUM BROMIDE 10 MG/ML (PF) SYRINGE
PREFILLED_SYRINGE | INTRAVENOUS | Status: DC | PRN
  Administered 2019-12-03: 50 mg via INTRAVENOUS

## 2019-12-03 MED ORDER — TOBRAMYCIN SULFATE 1.2 G IJ SOLR
INTRAMUSCULAR | Status: DC | PRN
  Administered 2019-12-03 (×2): 1.2 g

## 2019-12-03 MED ORDER — SODIUM CHLORIDE 0.9 % IV SOLN
2.0000 g | INTRAVENOUS | Status: DC
  Administered 2019-12-03 – 2019-12-04 (×2): 2 g via INTRAVENOUS
  Filled 2019-12-03: qty 2
  Filled 2019-12-03: qty 20
  Filled 2019-12-03: qty 2

## 2019-12-03 MED ORDER — 0.9 % SODIUM CHLORIDE (POUR BTL) OPTIME
TOPICAL | Status: DC | PRN
  Administered 2019-12-03: 1000 mL

## 2019-12-03 MED ORDER — ORAL CARE MOUTH RINSE
15.0000 mL | OROMUCOSAL | Status: DC
  Administered 2019-12-03 – 2019-12-06 (×20): 15 mL via OROMUCOSAL

## 2019-12-03 MED ORDER — DOCUSATE SODIUM 50 MG/5ML PO LIQD
100.0000 mg | Freq: Two times a day (BID) | ORAL | Status: DC
  Administered 2019-12-04: 100 mg
  Filled 2019-12-03 (×5): qty 10

## 2019-12-03 MED ORDER — IOHEXOL 300 MG/ML  SOLN
100.0000 mL | Freq: Once | INTRAMUSCULAR | Status: AC | PRN
  Administered 2019-12-03: 100 mL via INTRAVENOUS

## 2019-12-03 MED ORDER — LACTATED RINGERS IV SOLN
INTRAVENOUS | Status: DC | PRN
Start: 2019-12-03 — End: 2019-12-03

## 2019-12-03 MED ORDER — LACTATED RINGERS IV SOLN
INTRAVENOUS | Status: AC | PRN
  Administered 2019-12-03: 1000 mL via INTRAVENOUS

## 2019-12-03 MED ORDER — KCL IN DEXTROSE-NACL 20-5-0.45 MEQ/L-%-% IV SOLN: INTRAVENOUS | Status: DC

## 2019-12-03 MED ORDER — CHLORHEXIDINE GLUCONATE 0.12% ORAL RINSE (MEDLINE KIT)
15.0000 mL | Freq: Two times a day (BID) | OROMUCOSAL | Status: DC
  Administered 2019-12-03 – 2019-12-05 (×4): 15 mL via OROMUCOSAL

## 2019-12-03 MED ORDER — MIDAZOLAM HCL 5 MG/5ML IJ SOLN
INTRAMUSCULAR | Status: AC | PRN
  Administered 2019-12-03: 2 mg via INTRAVENOUS

## 2019-12-03 MED ORDER — SODIUM CHLORIDE 0.9% FLUSH
10.0000 mL | Freq: Two times a day (BID) | INTRAVENOUS | Status: DC
  Administered 2019-12-03 – 2019-12-09 (×10): 10 mL
  Administered 2019-12-09: 20 mL
  Administered 2019-12-10 – 2020-01-09 (×29): 10 mL

## 2019-12-03 MED ORDER — LIDOCAINE HCL 1 % IJ SOLN
INTRAMUSCULAR | Status: AC
  Filled 2019-12-03: qty 20

## 2019-12-03 MED ORDER — PROPOFOL 500 MG/50ML IV EMUL
INTRAVENOUS | Status: DC | PRN
  Administered 2019-12-03: 25 ug/kg/min via INTRAVENOUS

## 2019-12-03 MED ORDER — OXYCODONE HCL 5 MG PO TABS
5.0000 mg | ORAL_TABLET | ORAL | Status: DC | PRN
Start: 2019-12-03 — End: 2019-12-03

## 2019-12-03 MED ORDER — SODIUM CHLORIDE 0.9% FLUSH: 10.0000 mL | INTRAVENOUS | Status: DC | PRN

## 2019-12-03 MED ORDER — LIDOCAINE HCL 1 % IJ SOLN
INTRAMUSCULAR | Status: DC | PRN
  Administered 2019-12-03: 3 mL
  Administered 2019-12-03: 10 mL

## 2019-12-03 MED ORDER — SODIUM CHLORIDE 0.9 % IV SOLN
10.0000 mL/h | Freq: Once | INTRAVENOUS | Status: AC
  Administered 2019-12-03: 10 mL/h via INTRAVENOUS

## 2019-12-03 MED ORDER — OXYCODONE HCL 5 MG PO TABS
5.0000 mg | ORAL_TABLET | ORAL | Status: DC | PRN
  Filled 2019-12-03: qty 2

## 2019-12-03 MED ORDER — PHENYLEPHRINE HCL-NACL 10-0.9 MG/250ML-% IV SOLN
INTRAVENOUS | Status: DC | PRN
Start: 2019-12-03 — End: 2019-12-03
  Administered 2019-12-03: 25 ug/min via INTRAVENOUS

## 2019-12-03 MED ORDER — CALCIUM CHLORIDE 10 % IV SOLN
INTRAVENOUS | Status: DC | PRN
  Administered 2019-12-03: 300 mg via INTRAVENOUS

## 2019-12-03 MED ORDER — LACTATED RINGERS IV SOLN: INTRAVENOUS | Status: DC | PRN

## 2019-12-03 MED ORDER — POLYETHYLENE GLYCOL 3350 17 G PO PACK: 17.0000 g | PACK | Freq: Every day | ORAL | Status: DC | PRN

## 2019-12-03 MED ORDER — ONDANSETRON 4 MG PO TBDP: 4.0000 mg | ORAL_TABLET | Freq: Four times a day (QID) | ORAL | Status: DC | PRN

## 2019-12-03 MED ORDER — CHLORHEXIDINE GLUCONATE CLOTH 2 % EX PADS
6.0000 | MEDICATED_PAD | Freq: Every day | CUTANEOUS | Status: DC
  Administered 2019-12-03 – 2020-01-09 (×24): 6 via TOPICAL

## 2019-12-03 MED ORDER — PROPOFOL 1000 MG/100ML IV EMUL
INTRAVENOUS | Status: AC
  Filled 2019-12-03: qty 100

## 2019-12-03 MED ORDER — FENTANYL CITRATE (PF) 250 MCG/5ML IJ SOLN
INTRAMUSCULAR | Status: DC | PRN
  Administered 2019-12-03 (×2): 50 ug via INTRAVENOUS
  Administered 2019-12-03: 150 ug via INTRAVENOUS
  Administered 2019-12-03: 100 ug via INTRAVENOUS
  Administered 2019-12-03: 50 ug via INTRAVENOUS
  Administered 2019-12-03: 100 ug via INTRAVENOUS

## 2019-12-03 MED ORDER — ROCURONIUM BROMIDE 50 MG/5ML IV SOLN
INTRAVENOUS | Status: AC | PRN
  Administered 2019-12-03: 100 mg via INTRAVENOUS

## 2019-12-03 MED ORDER — IOHEXOL 300 MG/ML  SOLN
150.0000 mL | Freq: Once | INTRAMUSCULAR | Status: AC | PRN
  Administered 2019-12-03: 20 mL via INTRA_ARTERIAL

## 2019-12-03 MED ORDER — MIDAZOLAM HCL 2 MG/2ML IJ SOLN
INTRAMUSCULAR | Status: AC
  Administered 2019-12-03: 2 mg via INTRAVENOUS
  Filled 2019-12-03: qty 2

## 2019-12-03 MED ORDER — BACITRACIN ZINC 500 UNIT/GM EX OINT
TOPICAL_OINTMENT | CUTANEOUS | Status: AC
  Filled 2019-12-03: qty 28.35

## 2019-12-03 MED ORDER — LIDOCAINE-EPINEPHRINE 1 %-1:100000 IJ SOLN
INTRAMUSCULAR | Status: AC
  Filled 2019-12-03: qty 1

## 2019-12-03 MED ORDER — FENTANYL CITRATE (PF) 100 MCG/2ML IJ SOLN
INTRAMUSCULAR | Status: DC | PRN
  Administered 2019-12-03: 25 ug via INTRAVENOUS
  Administered 2019-12-03: 75 ug via INTRAVENOUS

## 2019-12-03 MED ORDER — PANTOPRAZOLE SODIUM 40 MG IV SOLR
40.0000 mg | Freq: Every day | INTRAVENOUS | Status: DC
  Administered 2019-12-04 – 2019-12-05 (×2): 40 mg via INTRAVENOUS
  Filled 2019-12-03 (×3): qty 40

## 2019-12-03 MED ORDER — PHENYLEPHRINE HCL (PRESSORS) 10 MG/ML IV SOLN
INTRAVENOUS | Status: DC | PRN
Start: 2019-12-03 — End: 2019-12-03
  Administered 2019-12-03: 80 ug via INTRAVENOUS
  Administered 2019-12-03: 40 ug via INTRAVENOUS

## 2019-12-03 MED ORDER — DEXAMETHASONE SODIUM PHOSPHATE 10 MG/ML IJ SOLN
INTRAMUSCULAR | Status: DC | PRN
  Administered 2019-12-03: 8 mg via INTRAVENOUS

## 2019-12-03 SURGICAL SUPPLY — 99 items
BIT DRILL 4.3 CALIBRATED (DRILL) ×5 IMPLANT
BIT DRILL CALIBRTD FREE HND4.3 (BIT) ×10 IMPLANT
BIT DRILL SURG TIB 3.3X152.5 (DRILL) ×5 IMPLANT
BLADE SURG 10 STRL SS (BLADE) ×14 IMPLANT
BLADE SURG 15 STRL LF DISP TIS (BLADE) ×5 IMPLANT
BLADE SURG 15 STRL SS (BLADE) ×2
BNDG COHESIVE 4X5 TAN STRL (GAUZE/BANDAGES/DRESSINGS) ×7 IMPLANT
BNDG ELASTIC 4X5.8 VLCR NS LF (GAUZE/BANDAGES/DRESSINGS) ×7 IMPLANT
BNDG ELASTIC 4X5.8 VLCR STR LF (GAUZE/BANDAGES/DRESSINGS) ×7 IMPLANT
BNDG ELASTIC 6X5.8 VLCR STR LF (GAUZE/BANDAGES/DRESSINGS) ×7 IMPLANT
BNDG GAUZE ELAST 4 BULKY (GAUZE/BANDAGES/DRESSINGS) ×14 IMPLANT
BRUSH SCRUB EZ PLAIN DRY (MISCELLANEOUS) ×14 IMPLANT
CANISTER WOUND CARE 500ML ATS (WOUND CARE) ×7 IMPLANT
CEMENT BONE R 1X40 (Cement) ×7 IMPLANT
CHLORAPREP W/TINT 26 (MISCELLANEOUS) ×7 IMPLANT
CLEANER TIP ELECTROSURG 2X2 (MISCELLANEOUS) ×7 IMPLANT
CLOSURE WOUND 1/2 X4 (GAUZE/BANDAGES/DRESSINGS)
COVER MAYO STAND STRL (DRAPES) ×7 IMPLANT
COVER SURGICAL LIGHT HANDLE (MISCELLANEOUS) ×14 IMPLANT
COVER WAND RF STERILE (DRAPES) ×7 IMPLANT
DRAPE C-ARM 42X72 X-RAY (DRAPES) ×7 IMPLANT
DRAPE C-ARMOR (DRAPES) ×7 IMPLANT
DRAPE HALF SHEET 40X57 (DRAPES) ×21 IMPLANT
DRAPE IMP U-DRAPE 54X76 (DRAPES) ×14 IMPLANT
DRAPE INCISE IOBAN 66X45 STRL (DRAPES) IMPLANT
DRAPE ORTHO SPLIT 77X108 STRL (DRAPES) ×4
DRAPE SURG 17X23 STRL (DRAPES) ×7 IMPLANT
DRAPE SURG ORHT 6 SPLT 77X108 (DRAPES) ×10 IMPLANT
DRAPE U-SHAPE 47X51 STRL (DRAPES) ×7 IMPLANT
DRILL 4.3 CALIBRATED (DRILL) ×7
DRILL CALIBRATED FREE HAND 4.3 (BIT) ×14
DRILL SURG TIB 3.3X152.5 (DRILL) ×7
DRSG ADAPTIC 3X8 NADH LF (GAUZE/BANDAGES/DRESSINGS) ×7 IMPLANT
DRSG MEPITEL 4X7.2 (GAUZE/BANDAGES/DRESSINGS) ×7 IMPLANT
ELECT COATED BLADE 2.86 ST (ELECTRODE) ×7 IMPLANT
ELECT NEEDLE BLADE 2-5/6 (NEEDLE) ×7 IMPLANT
ELECT REM PT RETURN 9FT ADLT (ELECTROSURGICAL) ×7
ELECTRODE REM PT RTRN 9FT ADLT (ELECTROSURGICAL) ×5 IMPLANT
EVACUATOR 1/8 PVC DRAIN (DRAIN) IMPLANT
GAUZE SPONGE 4X4 12PLY STRL (GAUZE/BANDAGES/DRESSINGS) ×7 IMPLANT
GAUZE SPONGE 4X4 12PLY STRL LF (GAUZE/BANDAGES/DRESSINGS) ×7 IMPLANT
GLOVE BIO SURGEON STRL SZ 6.5 (GLOVE) ×18 IMPLANT
GLOVE BIO SURGEON STRL SZ7.5 (GLOVE) ×28 IMPLANT
GLOVE BIO SURGEONS STRL SZ 6.5 (GLOVE) ×3
GLOVE BIOGEL M 7.0 STRL (GLOVE) ×7 IMPLANT
GLOVE BIOGEL PI IND STRL 6.5 (GLOVE) ×5 IMPLANT
GLOVE BIOGEL PI IND STRL 7.5 (GLOVE) ×5 IMPLANT
GLOVE BIOGEL PI INDICATOR 6.5 (GLOVE) ×2
GLOVE BIOGEL PI INDICATOR 7.5 (GLOVE) ×2
GOWN STRL REUS W/ TWL LRG LVL3 (GOWN DISPOSABLE) ×10 IMPLANT
GOWN STRL REUS W/TWL LRG LVL3 (GOWN DISPOSABLE) ×4
GUIDEWIRE HUMERL  BALL TIP 2.4 (WIRE) ×2
GUIDEWIRE HUMERL BALL TIP 2.4 (WIRE) ×5 IMPLANT
HANDPIECE INTERPULSE COAX TIP (DISPOSABLE)
KIT BASIN OR (CUSTOM PROCEDURE TRAY) ×7 IMPLANT
KIT PREVENA INCISION MGT20CM45 (CANNISTER) ×7 IMPLANT
KIT TURNOVER KIT B (KITS) ×7 IMPLANT
MANIFOLD NEPTUNE II (INSTRUMENTS) ×7 IMPLANT
NAIL TIB UNIV 10X400 (Nail) ×7 IMPLANT
NEEDLE 22X1 1/2 (OR ONLY) (NEEDLE) IMPLANT
NS IRRIG 1000ML POUR BTL (IV SOLUTION) ×7 IMPLANT
PACK ORTHO EXTREMITY (CUSTOM PROCEDURE TRAY) ×7 IMPLANT
PACK TOTAL JOINT (CUSTOM PROCEDURE TRAY) ×7 IMPLANT
PAD ARMBOARD 7.5X6 YLW CONV (MISCELLANEOUS) ×14 IMPLANT
PAD CAST 4YDX4 CTTN HI CHSV (CAST SUPPLIES) ×5 IMPLANT
PADDING CAST COTTON 4X4 STRL (CAST SUPPLIES) ×2
PADDING CAST COTTON 6X4 STRL (CAST SUPPLIES) ×7 IMPLANT
PIN GUIDE 3.0 THREADED (PIN) ×7 IMPLANT
SCREW BONE 5.0X35MM CORTICAL (Screw) ×7 IMPLANT
SCREW BONE 5.0X37.5MM CORT Z (Screw) ×14 IMPLANT
SCREW BONE 5.0X57.5MM CORTIC (Nail) ×7 IMPLANT
SCREW CANC FEM FA 5X30 (Screw) ×7 IMPLANT
SCREW CORT FA 4X40 (Screw) ×7 IMPLANT
SCREW CORT FA STD 5X60 (Screw) ×7 IMPLANT
SCREW SD IMF HEX 2.0X7 (Screw) ×14 IMPLANT
SCREW SD IMF HEX 2.0X9 (Screw) ×14 IMPLANT
SET HNDPC FAN SPRY TIP SCT (DISPOSABLE) IMPLANT
SPONGE LAP 18X18 RF (DISPOSABLE) ×7 IMPLANT
STAPLER VISISTAT 35W (STAPLE) ×7 IMPLANT
STRIP CLOSURE SKIN 1/2X4 (GAUZE/BANDAGES/DRESSINGS) IMPLANT
SUT CHROMIC 3 0 SH 27 (SUTURE) ×7 IMPLANT
SUT ETHILON 2 0 FS 18 (SUTURE) ×49 IMPLANT
SUT ETHILON 3 0 PS 1 (SUTURE) ×28 IMPLANT
SUT MNCRL AB 3-0 PS2 18 (SUTURE) ×7 IMPLANT
SUT MON AB 2-0 CT1 36 (SUTURE) ×21 IMPLANT
SUT PDS AB 0 CT 36 (SUTURE) IMPLANT
SUT PROLENE 0 CT (SUTURE) IMPLANT
SUT VIC AB 0 CT1 27 (SUTURE) ×2
SUT VIC AB 0 CT1 27XBRD ANBCTR (SUTURE) ×5 IMPLANT
SUT VIC AB 2-0 CT1 27 (SUTURE)
SUT VIC AB 2-0 CT1 TAPERPNT 27 (SUTURE) IMPLANT
SWAB CULTURE ESWAB REG 1ML (MISCELLANEOUS) IMPLANT
TOWEL GREEN STERILE (TOWEL DISPOSABLE) ×14 IMPLANT
TOWEL GREEN STERILE FF (TOWEL DISPOSABLE) ×14 IMPLANT
TUBE CONNECTING 12'X1/4 (SUCTIONS) ×1
TUBE CONNECTING 12X1/4 (SUCTIONS) ×6 IMPLANT
UNDERPAD 30X36 HEAVY ABSORB (UNDERPADS AND DIAPERS) ×7 IMPLANT
WATER STERILE IRR 1000ML POUR (IV SOLUTION) ×7 IMPLANT
YANKAUER SUCT BULB TIP NO VENT (SUCTIONS) ×7 IMPLANT

## 2019-12-03 NOTE — Anesthesia Procedure Notes (Signed)
Arterial Line Insertion Start/End5/06/2020 10:50 AM, 12/03/2019 10:55 AM Performed by: Carmela Rima, CRNA, CRNA  Patient location: OOR procedure area. Preanesthetic checklist: patient identified, IV checked and monitors and equipment checked Left, radial was placed Catheter size: 20 G Hand hygiene performed  and maximum sterile barriers used   Attempts: 1 Procedure performed without using ultrasound guided technique. Following insertion, dressing applied and Biopatch. Post procedure assessment: normal and unchanged  Patient tolerated the procedure well with no immediate complications.

## 2019-12-03 NOTE — Progress Notes (Signed)
Pt was transported via ventilator by RT to PACU bay 8. No complications present during transfer, RT will continue to monitor.

## 2019-12-03 NOTE — Anesthesia Postprocedure Evaluation (Signed)
Anesthesia Post Note  Patient: Paul Bender  Procedure(s) Performed: IR WITH ANESTHESIA (N/A )     Patient location during evaluation: SICU Anesthesia Type: General Level of consciousness: sedated Pain management: pain level controlled Vital Signs Assessment: post-procedure vital signs reviewed and stable Respiratory status: patient remains intubated per anesthesia plan Cardiovascular status: stable Postop Assessment: no apparent nausea or vomiting Anesthetic complications: no    Last Vitals:  Vitals:   12/03/19 1244 12/03/19 1300  BP: (!) 139/103 (!) 124/96  Pulse: 96 (!) 121  Resp: 16 14  Temp:    SpO2: 100% 100%    Last Pain:  Vitals:   12/03/19 0808  TempSrc: Oral  PainSc:                  Trevor Iha
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
## 2019-12-03 NOTE — Procedures (Signed)
Intubation Procedure Note Paul Bender [id number] 23-Jul-1991  Procedure: Intubation Indications: Airway protection and maintenance  Procedure Details Consent: Risks of procedure as well as the alternatives and risks of each were explained to the (patient/caregiver).  Consent for procedure obtained. Time Out: Verified patient identification, verified procedure, site/side was marked, verified correct patient position, special equipment/implants available, medications/allergies/relevent history reviewed, required imaging and test results available.  Performed  Maximum sterile technique was used including gloves, hand hygiene and mask.  4    Evaluation Hemodynamic Status: BP borderline, but stable throughout; O2 sats: stable throughout Patient's Current Condition: unstable Complications: No apparent complications Patient did tolerate procedure well. Chest X-ray ordered to verify placement.  CXR: tube position low-repostitioned, acceptable position on repeat CXR   Diamantina Monks 12/03/2019

## 2019-12-03 NOTE — Progress Notes (Signed)
RT note- Patient transported to IR and then handed off to anesthesia and placed on their ventilator.

## 2019-12-03 NOTE — Anesthesia Procedure Notes (Signed)
Procedure Name: Intubation Date/Time: 12/03/2019 3:26 PM Performed by: Marena Chancy, CRNA Pre-anesthesia Checklist: Patient identified, Emergency Drugs available, Suction available and Patient being monitored Patient Re-evaluated:Patient Re-evaluated prior to induction Oxygen Delivery Method: Circle System Utilized Preoxygenation: Pre-oxygenation with 100% oxygen Laryngoscope Size: Glidescope and 4 Grade View: Grade I Nasal Tubes: Right and Nasal Rae Tube size: 7.5 mm Number of attempts: 1 Airway Equipment and Method: Stylet and Oral airway Placement Confirmation: ETT inserted through vocal cords under direct vision,  positive ETCO2 and breath sounds checked- equal and bilateral Tube secured with: Tape Dental Injury: Teeth and Oropharynx as per pre-operative assessment
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
## 2019-12-03 NOTE — Anesthesia Postprocedure Evaluation (Signed)
Anesthesia Post Note  Patient: JAIKOB BORGWARDT  Procedure(s) Performed: IR WITH ANESTHESIA (N/A )     Patient location during evaluation: PACU Anesthesia Type: General Level of consciousness: awake and alert Pain management: pain level controlled Vital Signs Assessment: post-procedure vital signs reviewed and stable Respiratory status: spontaneous breathing, nonlabored ventilation, respiratory function stable and patient connected to nasal cannula oxygen Cardiovascular status: blood pressure returned to baseline and stable Postop Assessment: no apparent nausea or vomiting Anesthetic complications: no    Last Vitals:  Vitals:   12/03/19 1244 12/03/19 1300  BP: (!) 139/103 (!) 124/96  Pulse: 96 (!) 121  Resp: 16 14  Temp:    SpO2: 100% 100%    Last Pain:  Vitals:   12/03/19 0808  TempSrc: Oral  PainSc:                  Trevor Iha

## 2019-12-03 NOTE — Sedation Documentation (Signed)
Patient transported to PACU via RRT and CRNA on ventilator. Report given to PACU RN, groin site assessed with pulses palpated bilaterally of lower extremities

## 2019-12-03 NOTE — Progress Notes (Signed)
Orthopedic Tech Progress Note Patient Details:  Paul Bender [birth year] [id number]  Musculoskeletal Traction Type of Traction: [place]ucks Skin Traction Traction Location: RLE Traction Weight: 10 lbs   Post Interventions Patient Tolerated: Well   Paul Bender 12/03/2019, 10:13 AM

## 2019-12-03 NOTE — Anesthesia Procedure Notes (Signed)
Date/Time: 12/03/2019 3:04 PM Performed by: Marena Chancy, CRNA Pre-anesthesia Checklist: Patient identified, Emergency Drugs available, Suction available, Timeout performed and Patient being monitored Patient Re-evaluated:Patient Re-evaluated prior to induction Oxygen Delivery Method: Circle system utilized Preoxygenation: Pre-oxygenation with 100% oxygen Induction Type: Inhalational induction with existing ETT
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
## 2019-12-03 NOTE — Consult Note (Signed)
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 TN intact  Motor EHL, ext, flex, evers 5/5  DP 2+, PT 2+, No significant edema  LLE No traumatic wounds, ecchymosis, or rash  TTP tibia  No knee or ankle effusion  Sens DPN, SPN, TN intact  Motor EHL, ext, flex, evers grossly intact  DP dopplerable, No significant edema  Neurological: He is alert.  Skin: Skin is warm and dry. He is not diaphoretic.  Psychiatric: His speech is normal. His affect is blunt. He is slowed and withdrawn. He expresses suicidal ideation.      Assessment/Plan: Right acet fx with active extrav -- Going to IR to attempt to control bleeding. Will place in [employer] traction until he can have traction pin placed. Will need ORIF at later date. Left open tibia fx -- Will need to go to OR for I&D, ex fix vs IMN later today. Has already received abx. Facial fxs Suicidal ideation Tobacco use    Lisette Abu, PA-C Orthopedic Surgery [phone] 12/03/2019, 9:52 AM

## 2019-12-03 NOTE — Sedation Documentation (Signed)
Left radial arterial line and central line via right IJ placed by anesthesia

## 2019-12-03 NOTE — Transfer of Care (Signed)
Immediate Anesthesia Transfer of Care Note  Patient: EFSTATHIOS SAWIN  Procedure(s) Performed: IR WITH ANESTHESIA (N/A )  Patient Location: PACU  Anesthesia Type:General  Level of Consciousness: Patient remains intubated per anesthesia plan  Airway & Oxygen Therapy: Patient remains intubated per anesthesia plan and Patient placed on Ventilator (see vital sign flow sheet for setting)  Post-op Assessment: Report given to RN and Post -op Vital signs reviewed and stable  Post vital signs: Reviewed and stable  Last Vitals:  Vitals Value Taken Time  BP 132/86 12/03/19 1212  Temp    Pulse 88 12/03/19 1219  Resp 16 12/03/19 1219  SpO2 100 % 12/03/19 1219  Vitals shown include unvalidated device data.  Last Pain:  Vitals:   12/03/19 0808  TempSrc: Oral  PainSc:          Complications: No apparent anesthesia complications

## 2019-12-03 NOTE — Anesthesia Procedure Notes (Signed)
Central Venous Catheter Insertion Performed by: Oleta Mouse, MD, anesthesiologist Start/End5/06/2020 10:44 AM, 12/03/2019 10:52 AM Patient location: OR. Preanesthetic checklist: patient identified, IV checked, site marked, risks and benefits discussed, surgical consent, monitors and equipment checked, pre-op evaluation, timeout performed and anesthesia consent Patient sedated Hand hygiene performed  and maximum sterile barriers used  Catheter size: 9 Fr Total catheter length 10. MAC introducer Procedure performed using ultrasound guided technique. Ultrasound Notes:anatomy identified, needle tip was noted to be adjacent to the nerve/plexus identified, no ultrasound evidence of intravascular and/or intraneural injection and image(s) printed for medical record Attempts: 1 Following insertion, line sutured, dressing applied and Biopatch. Post procedure assessment: blood return through all ports, free fluid flow and no air  Patient tolerated the procedure well with no immediate complications.

## 2019-12-03 NOTE — Op Note (Signed)
Operative Note: MANDIBULO-MAXILLARY FIXATION   DEBRIDEMENT AND CLOSURE OF SCALP LACERATION  Patient: Paul Bender  Medical record number: [id number]  Date:12/03/2019  Pre-operative Indications: 1. Mandible fracture     2. 4cm Scalp Laceration  Postoperative Indications: Same  Surgical Procedure: 1. Mandibulo-Maxillary Fixation    2.  Debridement and closure of complex scalp laceration  Anesthesia: GET  Surgeon: Barbee Cough, M.D.  Complications: None  EBL: Less than 50 cc   Brief History: The patient is a 29 y.o. male with a history of acute mandible fracture.  Patient admitted to [facility] for multisystem trauma.  Maxillofacial CT scan showed minimally displaced right orbital floor fracture and nondisplaced vertically oriented mandibular ramus fracture.  Given the patient's history and findings, I recommended mandibulo-maxillary fixation under general anesthesia.  The patient was intubated at the time of evaluation, procedure declared an emergency and no consent was obtained.  Family not available for preoperative discussion.  Surgical Procedure: The patient is brought to the operating room on 12/03/2019 and placed in supine position on the operating table. General nasal endotracheal anesthesia was established without difficulty. When the patient was adequately anesthetized, surgical timeout was performed and correct identification of the patient and the surgical procedure. The patient was positioned and prepped and draped in sterile fashion.  The patient was injected with 2 cc of 1% lidocaine 1 100,000 dilution epinephrine in a submucosal fashion along the anticipated incision lines.  An orogastric tube was passed and the stomach contents were aspirated.  The patient's oral cavity was thoroughly examined.  Findings include good dentition and occlusion.  No evidence of intraoral laceration.  With the patient's adequate dentition and stable fracture site, I  was able to place him in good occlusion.  Mandibular maxillary fixation was then undertaken with four-point fixation using Leibinger mandible fracture set.  Mucosal incisions were created using Bovie electrocautery, after elevating the periosteum 8 and 12 mm screws were placed in the maxilla and mandible respectively.  Fixation wiring was then undertaken using 26-gauge stainless steel wire.  The patient's incisions were closed with interrupted 4-0 chromic suture.  The patient's wound was carefully examined and cleaned of foreign material using half-strength hydroperoxide and Betadine surgical prep.  The patient had a 4 cm vertically oriented scalp laceration extending from the hairline posteriorly.  Deep subcutaneous tissue was closed with interrupted 4-0 Vicryl sutures.  Skin edge closed with surgical staples.  Wound dressed with bacitracin ointment.   An orogastric tube was passed and stomach contents were aspirated. Patient was awakened from anesthetic, extubated and transferred from the operating room to the recovery room in stable condition. There were no complications and blood loss was minimal.   Barbee Cough, M.D. [facility] 12/03/2019

## 2019-12-03 NOTE — ED Notes (Signed)
Pt transported to CT with RN

## 2019-12-03 NOTE — Progress Notes (Signed)
Orthopedic Tech Progress Note Patient Details:  Paul Bender 03-27-1991 [id number]  Ortho Devices Type of Ortho Device: Short leg splint Ortho Device/Splint Location: LLE Ortho Device/Splint Interventions: Application   Post Interventions Patient Tolerated: Well    [name] 12/03/2019, 9:51 AM

## 2019-12-03 NOTE — Progress Notes (Signed)
PT Cancellation Note  Patient Details Name: STEED KANAAN MRN: [id number] DOB: 12/13/1990   Cancelled Treatment:    Reason Eval/Treat Not Completed: Medical issues which prohibited therapy Pt being intubated, attempting to take to IR to control LE bleeding and being placed in [facility] traction. Will follow up as pt medically appropriate.   Cindee Salt, DPT  Acute Rehabilitation Services  Pager: [phone] Office: [phone]  Lehman Prom 12/03/2019, 11:29 AM

## 2019-12-03 NOTE — Progress Notes (Signed)
Orthopedic Tech Progress Note Patient Details:  Paul Bender April 23, 1991 [id number] Level 2 Trauma Patient ID: Paul Bender, male   DOB: Oct 12, 1990, 29 y.o.   MRN: [id number]   Paul Bender 12/03/2019, 8:25 AM
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
## 2019-12-03 NOTE — Progress Notes (Signed)
Pt transported from 4N31 to CT and back with no complications.
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
## 2019-12-03 NOTE — Transfer of Care (Signed)
Immediate Anesthesia Transfer of Care Note  Patient: Paul Bender  Procedure(s) Performed: INSERTION OF TRACTION PIN (Right ) IRRIGATION AND DEBRIDEMENT EXTREMITY (Left ) INTRAMEDULLARY (IM) NAIL TIBIAL (Left ) MANDIBULAR-MAXILLARY FIXATION (N/A Face) CLOSURE SCALP LACERATION (Right Face)  Patient Location: ICU  Anesthesia Type:General  Level of Consciousness: sedated and Patient remains intubated per anesthesia plan  Airway & Oxygen Therapy: Patient remains intubated per anesthesia plan and Patient placed on Ventilator (see vital sign flow sheet for setting)  Post-op Assessment: Report given to RN and Post -op Vital signs reviewed and stable  Post vital signs: Reviewed and stable  Last Vitals:  Vitals Value Taken Time  BP 126/107 12/03/19 1825  Temp    Pulse 102 12/03/19 1826  Resp 16 12/03/19 1826  SpO2 100 % 12/03/19 1826  Vitals shown include unvalidated device data.  Last Pain:  Vitals:   12/03/19 0808  TempSrc: Oral  PainSc:          Complications: No apparent anesthesia complications

## 2019-12-03 NOTE — Progress Notes (Signed)
Responded to referral and level 2 upgrade to level 1 to continue support.  Patient is alert and will  Possibly be going for surgery later.  Will continue to follow as needed.    Venida Jarvis, [place], [facility], Pager [phone]

## 2019-12-04 ENCOUNTER — Inpatient Hospital Stay (HOSPITAL_COMMUNITY): Payer: No Typology Code available for payment source

## 2019-12-04 ENCOUNTER — Inpatient Hospital Stay (HOSPITAL_COMMUNITY): Payer: No Typology Code available for payment source | Admitting: Certified Registered Nurse Anesthetist

## 2019-12-04 ENCOUNTER — Encounter (HOSPITAL_COMMUNITY)
Admission: EM | Disposition: A | Discharge status: Other Acute Inpatient Hospital | Payer: Self-pay | Source: Home / Self Care

## 2019-12-04 DIAGNOSIS — I9742 Intraoperative hemorrhage and hematoma of a circulatory system organ or structure complicating other procedure: Secondary | ICD-10-CM

## 2019-12-04 HISTORY — PX: ORIF ACETABULAR FRACTURE: SHX5029

## 2019-12-04 LAB — BASIC METABOLIC PANEL
Anion gap: 8 (ref 5–15)
Anion gap: 6 (ref 5–15)
BUN: 13 mg/dL (ref 6–20)
BUN: 11 mg/dL (ref 6–20)
CO2: 21 mmol/L — ABNORMAL LOW (ref 22–32)
CO2: 22 mmol/L (ref 22–32)
Calcium: 7.3 mg/dL — ABNORMAL LOW (ref 8.9–10.3)
Calcium: 7.2 mg/dL — ABNORMAL LOW (ref 8.9–10.3)
Chloride: 109 mmol/L (ref 98–111)
Chloride: 113 mmol/L — ABNORMAL HIGH (ref 98–111)
Creatinine, Ser: 1.01 mg/dL (ref 0.61–1.24)
Creatinine, Ser: 0.83 mg/dL (ref 0.61–1.24)
GFR calc Af Amer: >60 mL/min (ref >60)
GFR calc Af Amer: >60 mL/min (ref >60)
GFR calc non Af Amer: >60 mL/min (ref >60)
GFR calc non Af Amer: >60 mL/min (ref >60)
Glucose, Bld: 136 mg/dL — ABNORMAL HIGH (ref 70–99)
Glucose, Bld: 122 mg/dL — ABNORMAL HIGH (ref 70–99)
Potassium: 3.9 mmol/L (ref 3.5–5.1)
Potassium: 4 mmol/L (ref 3.5–5.1)
Sodium: 138 mmol/L (ref 135–145)
Sodium: 141 mmol/L (ref 135–145)

## 2019-12-04 LAB — CBC
HCT: 21.6 % — ABNORMAL LOW (ref 39.0–52.0)
HCT: 30 % — ABNORMAL LOW (ref 39.0–52.0)
Hemoglobin: 7.2 g/dL — ABNORMAL LOW (ref 13.0–17.0)
Hemoglobin: 10.3 g/dL — ABNORMAL LOW (ref 13.0–17.0)
MCH: 30.3 pg (ref 26.0–34.0)
MCH: 29.1 pg (ref 26.0–34.0)
MCHC: 33.3 g/dL (ref 30.0–36.0)
MCHC: 34.3 g/dL (ref 30.0–36.0)
MCV: 90.8 fL (ref 80.0–100.0)
MCV: 84.7 fL (ref 80.0–100.0)
Platelets: 64 10*3/uL — ABNORMAL LOW (ref 150–400)
Platelets: 70 10*3/uL — ABNORMAL LOW (ref 150–400)
RBC: 2.38 MIL/uL — ABNORMAL LOW (ref 4.22–5.81)
RBC: 3.54 MIL/uL — ABNORMAL LOW (ref 4.22–5.81)
RDW: 16.2 % — ABNORMAL HIGH (ref 11.5–15.5)
RDW: 15.1 % (ref 11.5–15.5)
WBC: 9 10*3/uL (ref 4.0–10.5)
WBC: 5.7 10*3/uL (ref 4.0–10.5)
nRBC: 0 % (ref 0.0–0.2)
nRBC: 0 % (ref 0.0–0.2)

## 2019-12-04 LAB — TRIGLYCERIDES: Triglycerides: 69 mg/dL (ref <150)

## 2019-12-04 LAB — BPAM FFP
Blood Product Expiration Date: 202105132359
ISSUE DATE / TIME: 202105121009
Unit Type and Rh: 6200

## 2019-12-04 LAB — VITAMIN D 25 HYDROXY (VIT D DEFICIENCY, FRACTURES): Vit D, 25-Hydroxy: 22.25 ng/mL — ABNORMAL LOW (ref 30–100)

## 2019-12-04 LAB — PREPARE FRESH FROZEN PLASMA: Unit division: 0

## 2019-12-04 LAB — PREPARE RBC (CROSSMATCH)

## 2019-12-04 LAB — MAGNESIUM: Magnesium: 1.6 mg/dL — ABNORMAL LOW (ref 1.7–2.4)

## 2019-12-04 LAB — BLOOD PRODUCT ORDER (VERBAL) VERIFICATION

## 2019-12-04 LAB — PROTIME-INR
INR: 1.2 (ref 0.8–1.2)
Prothrombin Time: 15 seconds (ref 11.4–15.2)

## 2019-12-04 SURGERY — OPEN REDUCTION INTERNAL FIXATION (ORIF) ACETABULAR FRACTURE
Anesthesia: General | Site: Shoulder | Laterality: Right

## 2019-12-04 MED ORDER — SODIUM CHLORIDE 0.9 % IR SOLN
Status: DC | PRN
  Administered 2019-12-04: 3000 mL

## 2019-12-04 MED ORDER — MIDAZOLAM HCL 2 MG/2ML IJ SOLN
INTRAMUSCULAR | Status: AC
  Filled 2019-12-04: qty 2

## 2019-12-04 MED ORDER — FENTANYL CITRATE (PF) 250 MCG/5ML IJ SOLN
INTRAMUSCULAR | Status: AC
  Filled 2019-12-04: qty 5

## 2019-12-04 MED ORDER — SODIUM CHLORIDE 0.9% IV SOLUTION: Freq: Once | INTRAVENOUS | Status: AC

## 2019-12-04 MED ORDER — ROCURONIUM BROMIDE 10 MG/ML (PF) SYRINGE
PREFILLED_SYRINGE | INTRAVENOUS | Status: DC | PRN
  Administered 2019-12-04: 30 mg via INTRAVENOUS
  Administered 2019-12-04: 10 mg via INTRAVENOUS
  Administered 2019-12-04: 20 mg via INTRAVENOUS
  Administered 2019-12-04: 30 mg via INTRAVENOUS
  Administered 2019-12-04: 50 mg via INTRAVENOUS
  Administered 2019-12-04: 40 mg via INTRAVENOUS
  Administered 2019-12-04: 20 mg via INTRAVENOUS
  Administered 2019-12-04 (×2): 50 mg via INTRAVENOUS

## 2019-12-04 MED ORDER — SODIUM CHLORIDE 0.9% IV SOLUTION: Freq: Once | INTRAVENOUS | Status: DC

## 2019-12-04 MED ORDER — METHOCARBAMOL 500 MG PO TABS: 500.0000 mg | ORAL_TABLET | Freq: Four times a day (QID) | ORAL | Status: DC | PRN

## 2019-12-04 MED ORDER — VANCOMYCIN HCL 1000 MG IV SOLR
INTRAVENOUS | Status: AC
  Filled 2019-12-04: qty 1000

## 2019-12-04 MED ORDER — TOBRAMYCIN SULFATE 1.2 G IJ SOLR
INTRAMUSCULAR | Status: AC
  Filled 2019-12-04: qty 1.2

## 2019-12-04 MED ORDER — ALBUMIN HUMAN 5 % IV SOLN
INTRAVENOUS | Status: DC | PRN
Start: 2019-12-04 — End: 2019-12-04

## 2019-12-04 MED ORDER — TOBRAMYCIN SULFATE 1.2 G IJ SOLR
INTRAMUSCULAR | Status: DC | PRN
  Administered 2019-12-04: 1.2 g via TOPICAL

## 2019-12-04 MED ORDER — LACTATED RINGERS IV SOLN: INTRAVENOUS | Status: DC | PRN

## 2019-12-04 MED ORDER — OXYCODONE HCL 5 MG PO TABS
5.0000 mg | ORAL_TABLET | ORAL | Status: DC | PRN
  Administered 2019-12-04 – 2019-12-05 (×2): 10 mg
  Filled 2019-12-04: qty 2

## 2019-12-04 MED ORDER — ONDANSETRON HCL 4 MG PO TABS: 4.0000 mg | ORAL_TABLET | Freq: Four times a day (QID) | ORAL | Status: DC | PRN

## 2019-12-04 MED ORDER — METHOCARBAMOL 1000 MG/10ML IJ SOLN
500.0000 mg | Freq: Four times a day (QID) | INTRAVENOUS | Status: DC | PRN
  Filled 2019-12-04: qty 5

## 2019-12-04 MED ORDER — EPHEDRINE SULFATE 50 MG/ML IJ SOLN
INTRAMUSCULAR | Status: DC | PRN
  Administered 2019-12-04 (×2): 5 mg via INTRAVENOUS

## 2019-12-04 MED ORDER — SODIUM CHLORIDE 0.9 % IV SOLN
INTRAVENOUS | Status: AC
  Filled 2019-12-04: qty 1.2

## 2019-12-04 MED ORDER — EPHEDRINE 5 MG/ML INJ
INTRAVENOUS | Status: AC
  Filled 2019-12-04: qty 10

## 2019-12-04 MED ORDER — 0.9 % SODIUM CHLORIDE (POUR BTL) OPTIME
TOPICAL | Status: DC | PRN
  Administered 2019-12-04: 1000 mL

## 2019-12-04 MED ORDER — ROCURONIUM BROMIDE 10 MG/ML (PF) SYRINGE
PREFILLED_SYRINGE | INTRAVENOUS | Status: AC
  Filled 2019-12-04: qty 40

## 2019-12-04 MED ORDER — PHENYLEPHRINE 40 MCG/ML (10ML) SYRINGE FOR IV PUSH (FOR BLOOD PRESSURE SUPPORT)
PREFILLED_SYRINGE | INTRAVENOUS | Status: AC
  Filled 2019-12-04: qty 20

## 2019-12-04 MED ORDER — DEXAMETHASONE SODIUM PHOSPHATE 10 MG/ML IJ SOLN
INTRAMUSCULAR | Status: AC
  Filled 2019-12-04: qty 2

## 2019-12-04 MED ORDER — METOCLOPRAMIDE HCL 5 MG PO TABS
5.0000 mg | ORAL_TABLET | Freq: Three times a day (TID) | ORAL | Status: DC | PRN
  Filled 2019-12-04: qty 2

## 2019-12-04 MED ORDER — LIDOCAINE 2% (20 MG/ML) 5 ML SYRINGE
INTRAMUSCULAR | Status: AC
  Filled 2019-12-04: qty 10

## 2019-12-04 MED ORDER — ONDANSETRON HCL 4 MG/2ML IJ SOLN
INTRAMUSCULAR | Status: AC
  Filled 2019-12-04: qty 4

## 2019-12-04 MED ORDER — ONDANSETRON HCL 4 MG/2ML IJ SOLN
4.0000 mg | Freq: Four times a day (QID) | INTRAMUSCULAR | Status: DC | PRN
  Administered 2019-12-06: 4 mg via INTRAVENOUS
  Filled 2019-12-04: qty 2

## 2019-12-04 MED ORDER — SODIUM CHLORIDE 0.9 % IV SOLN
INTRAVENOUS | Status: DC | PRN
Start: 2019-12-04 — End: 2019-12-04

## 2019-12-04 MED ORDER — METOCLOPRAMIDE HCL 5 MG/ML IJ SOLN: 5.0000 mg | Freq: Three times a day (TID) | INTRAMUSCULAR | Status: DC | PRN

## 2019-12-04 MED ORDER — CALCIUM CHLORIDE 10 % IV SOLN
INTRAVENOUS | Status: DC | PRN
  Administered 2019-12-04 (×5): 100 mg via INTRAVENOUS

## 2019-12-04 MED ORDER — DEXMEDETOMIDINE HCL IN NACL 400 MCG/100ML IV SOLN
0.4000 ug/kg/h | INTRAVENOUS | Status: DC
  Administered 2019-12-04 (×2): 0.4 ug/kg/h via INTRAVENOUS
  Administered 2019-12-04: 0.8 ug/kg/h via INTRAVENOUS
  Administered 2019-12-05: 1 ug/kg/h via INTRAVENOUS
  Administered 2019-12-05: 0.8 ug/kg/h via INTRAVENOUS
  Filled 2019-12-04 (×4): qty 100

## 2019-12-04 MED ORDER — TRANEXAMIC ACID-NACL 1000-0.7 MG/100ML-% IV SOLN
INTRAVENOUS | Status: DC | PRN
  Administered 2019-12-04: 1000 mg via INTRAVENOUS

## 2019-12-04 MED ORDER — PHENYLEPHRINE HCL-NACL 10-0.9 MG/250ML-% IV SOLN
INTRAVENOUS | Status: DC | PRN
  Administered 2019-12-04: 25 ug/min via INTRAVENOUS

## 2019-12-04 MED ORDER — PHENYLEPHRINE HCL (PRESSORS) 10 MG/ML IV SOLN
INTRAVENOUS | Status: DC | PRN
Start: 2019-12-04 — End: 2019-12-04
  Administered 2019-12-04 (×2): 80 ug via INTRAVENOUS

## 2019-12-04 MED ORDER — TRANEXAMIC ACID-NACL 1000-0.7 MG/100ML-% IV SOLN
INTRAVENOUS | Status: AC
  Filled 2019-12-04: qty 100

## 2019-12-04 MED ORDER — VANCOMYCIN HCL 1 G IV SOLR
INTRAVENOUS | Status: DC | PRN
  Administered 2019-12-04: 1000 mg via TOPICAL

## 2019-12-04 SURGICAL SUPPLY — 109 items
APPLIER CLIP 11 MED OPEN (CLIP) ×2
BIT DRILL CANN 4.5MM (BIT) IMPLANT
BIT DRILL FLUTED 2.5 (BIT) IMPLANT
BIT DRILL STEP 3.5 (DRILL) IMPLANT
BLADE CLIPPER SURG (BLADE) IMPLANT
BLADE SURG 10 STRL SS (BLADE) ×1 IMPLANT
BLADE SURG 11 STRL SS (BLADE) ×1 IMPLANT
BNDG ELASTIC 4X5.8 VLCR STR LF (GAUZE/BANDAGES/DRESSINGS) ×1 IMPLANT
BNDG ELASTIC 6X5.8 VLCR STR LF (GAUZE/BANDAGES/DRESSINGS) ×1 IMPLANT
BRUSH SCRUB EZ PLAIN DRY (MISCELLANEOUS) ×3 IMPLANT
CHLORAPREP W/TINT 26 (MISCELLANEOUS) ×2 IMPLANT
CLIP APPLIE 11 MED OPEN (CLIP) IMPLANT
CLIP VESOCCLUDE MED 6/CT (CLIP) ×1 IMPLANT
CLIP VESOCCLUDE SM WIDE 6/CT (CLIP) ×1 IMPLANT
COUNTER NEEDLE 20 DBL MAG RED (NEEDLE) ×1 IMPLANT
COVER SURGICAL LIGHT HANDLE (MISCELLANEOUS) ×2 IMPLANT
COVER WAND RF STERILE (DRAPES) ×1 IMPLANT
DERMABOND ADVANCED (GAUZE/BANDAGES/DRESSINGS) ×1
DERMABOND ADVANCED .7 DNX12 (GAUZE/BANDAGES/DRESSINGS) IMPLANT
DRAPE C-ARM 42X72 X-RAY (DRAPES) ×2 IMPLANT
DRAPE C-ARMOR (DRAPES) ×2 IMPLANT
DRAPE INCISE IOBAN 66X45 STRL (DRAPES) ×2 IMPLANT
DRAPE INCISE IOBAN 85X60 (DRAPES) ×2 IMPLANT
DRAPE ORTHO SPLIT 77X108 STRL (DRAPES) ×2
DRAPE SURG ORHT 6 SPLT 77X108 (DRAPES) ×2 IMPLANT
DRAPE U-SHAPE 47X51 STRL (DRAPES) ×1 IMPLANT
DRILL BIT CANN 4.5MM (BIT) ×1
DRILL BIT FLUTED 2.5 (BIT) ×2
DRILL STEP 3.5 (DRILL)
DRSG MEPILEX BORDER 4X12 (GAUZE/BANDAGES/DRESSINGS) IMPLANT
DRSG MEPILEX BORDER 4X4 (GAUZE/BANDAGES/DRESSINGS) ×1 IMPLANT
DRSG MEPILEX BORDER 4X8 (GAUZE/BANDAGES/DRESSINGS) ×1 IMPLANT
DRSG MEPITEL 4X7.2 (GAUZE/BANDAGES/DRESSINGS) ×1 IMPLANT
ELECT BLADE 6.5 EXT (BLADE) ×2 IMPLANT
ELECT REM PT RETURN 9FT ADLT (ELECTROSURGICAL) ×2
ELECTRODE REM PT RTRN 9FT ADLT (ELECTROSURGICAL) ×1 IMPLANT
GAUZE SPONGE 4X4 12PLY STRL (GAUZE/BANDAGES/DRESSINGS) ×1 IMPLANT
GLOVE BIO SURGEON STRL SZ 6.5 (GLOVE) ×6 IMPLANT
GLOVE BIO SURGEON STRL SZ7.5 (GLOVE) ×6 IMPLANT
GLOVE BIOGEL PI IND STRL 6.5 (GLOVE) ×1 IMPLANT
GLOVE BIOGEL PI IND STRL 7.0 (GLOVE) IMPLANT
GLOVE BIOGEL PI IND STRL 7.5 (GLOVE) ×1 IMPLANT
GLOVE BIOGEL PI INDICATOR 6.5 (GLOVE) ×1
GLOVE BIOGEL PI INDICATOR 7.0 (GLOVE) ×4
GLOVE BIOGEL PI INDICATOR 7.5 (GLOVE) ×1
GLOVE SURG SS PI 6.5 STRL IVOR (GLOVE) ×1 IMPLANT
GLOVE SURG SS PI 7.0 STRL IVOR (GLOVE) ×4 IMPLANT
GOWN STRL REUS W/ TWL LRG LVL3 (GOWN DISPOSABLE) ×2 IMPLANT
GOWN STRL REUS W/TWL LRG LVL3 (GOWN DISPOSABLE) ×6
GUIDEWIRE 2.0MM (WIRE) ×1 IMPLANT
GUIDEWIRE THREADED 2.8MM (WIRE) ×1 IMPLANT
HANDPIECE INTERPULSE COAX TIP (DISPOSABLE) ×1
K-WIRE (WIRE) ×4 IMPLANT
KIT BASIN OR (CUSTOM PROCEDURE TRAY) ×2 IMPLANT
KIT TURNOVER KIT B (KITS) ×2 IMPLANT
LOOP VESSEL MAXI BLUE (MISCELLANEOUS) ×1 IMPLANT
MANIFOLD NEPTUNE II (INSTRUMENTS) ×2 IMPLANT
NS IRRIG 1000ML POUR BTL (IV SOLUTION) ×2 IMPLANT
PACK TOTAL JOINT (CUSTOM PROCEDURE TRAY) ×2 IMPLANT
PAD ABD 8X10 STRL (GAUZE/BANDAGES/DRESSINGS) ×1 IMPLANT
PAD ARMBOARD 7.5X6 YLW CONV (MISCELLANEOUS) ×4 IMPLANT
PAD CAST 4YDX4 CTTN HI CHSV (CAST SUPPLIES) IMPLANT
PADDING CAST COTTON 4X4 STRL (CAST SUPPLIES) ×1
PADDING CAST COTTON 6X4 STRL (CAST SUPPLIES) ×1 IMPLANT
PLATE BONE 78MM 6HOLE PELVIC (Plate) ×1 IMPLANT
PLATE BONE 91MM 7HOLE PELVIC (Plate) IMPLANT
PLATE PELVIC 8H 3.5 (Plate) ×1 IMPLANT
RETRIEVER SUT HEWSON (MISCELLANEOUS) ×1 IMPLANT
SCREW CANN 6.5X130X32 (Screw) ×1 IMPLANT
SCREW CORTEX 3.5 24MM (Screw) ×1 IMPLANT
SCREW CORTEX 3.5 26MM (Screw) ×1 IMPLANT
SCREW CORTEX 3.5 50MM (Screw) ×2 IMPLANT
SCREW CORTEX 3.5 55MM (Screw) ×1 IMPLANT
SCREW CORTEX 3.5 60MM (Screw) ×1 IMPLANT
SCREW CORTEX 3.5 70MM SELF TAP (Screw) ×2 IMPLANT
SCREW CORTEX 3.5X90 (Screw) ×1 IMPLANT
SCREW LOCK CORT ST 3.5X24 (Screw) IMPLANT
SCREW LOCK CORT ST 3.5X26 (Screw) IMPLANT
SCREW SCHNZ SD 5.0 80 THRD/200 (Screw) IMPLANT
SCRW SCHANZ SD 5.0 80 THRD/200 (Screw) ×2 IMPLANT
SET HNDPC FAN SPRY TIP SCT (DISPOSABLE) ×1 IMPLANT
SPONGE LAP 18X18 RF (DISPOSABLE) IMPLANT
STAPLER VISISTAT 35W (STAPLE) ×2 IMPLANT
SUCTION FRAZIER HANDLE 10FR (MISCELLANEOUS) ×1
SUCTION TUBE FRAZIER 10FR DISP (MISCELLANEOUS) ×1 IMPLANT
SUT BONE WAX W31G (SUTURE) ×1 IMPLANT
SUT ETHILON 2 0 PSLX (SUTURE) ×4 IMPLANT
SUT FIBERWIRE #2 38 T-5 BLUE (SUTURE) ×4
SUT MNCRL AB 3-0 PS2 18 (SUTURE) ×3 IMPLANT
SUT MON AB 2-0 CT1 36 (SUTURE) ×1 IMPLANT
SUT PROLENE 5 0 C 1 24 (SUTURE) ×2 IMPLANT
SUT PROLENE 5 0 C1 (SUTURE) ×2 IMPLANT
SUT SILK 2 0 (SUTURE) ×1
SUT SILK 2-0 18XBRD TIE 12 (SUTURE) IMPLANT
SUT SILK 3 0 (SUTURE) ×1
SUT SILK 3-0 18XBRD TIE 12 (SUTURE) IMPLANT
SUT VIC AB 0 CT1 27 (SUTURE)
SUT VIC AB 0 CT1 27XBRD ANBCTR (SUTURE) ×1 IMPLANT
SUT VIC AB 1 CT1 18XCR BRD 8 (SUTURE) ×1 IMPLANT
SUT VIC AB 1 CT1 27 (SUTURE)
SUT VIC AB 1 CT1 27XBRD ANBCTR (SUTURE) ×1 IMPLANT
SUT VIC AB 1 CT1 8-18 (SUTURE) ×1
SUT VIC AB 2-0 CT1 27 (SUTURE) ×4
SUT VIC AB 2-0 CT1 TAPERPNT 27 (SUTURE) ×1 IMPLANT
SUTURE FIBERWR #2 38 T-5 BLUE (SUTURE) ×2 IMPLANT
TOWEL GREEN STERILE (TOWEL DISPOSABLE) ×4 IMPLANT
TOWEL GREEN STERILE FF (TOWEL DISPOSABLE) ×4 IMPLANT
TRAY FOLEY MTR SLVR 16FR STAT (SET/KITS/TRAYS/PACK) IMPLANT
WATER STERILE IRR 1000ML POUR (IV SOLUTION) IMPLANT
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
## 2019-12-04 NOTE — Social Work (Signed)
CSW was unable to complete sbirt due to pt being on the ventilator. CSW may attempt to complete at more appropriate time.   Wane Mollett, LCSWA, LCASA Clinical Social Worker [phone]    

## 2019-12-04 NOTE — Transfer of Care (Signed)
Immediate Anesthesia Transfer of Care Note  Patient: Paul Bender  Procedure(s) Performed: OPEN REDUCTION INTERNAL FIXATION (ORIF) ACETABULAR FRACTURE W/ DRESSING CHANGE LEFT LEG (Right Shoulder)  Patient Location: ICU  Anesthesia Type:General  Level of Consciousness: sedated and Patient remains intubated per anesthesia plan  Airway & Oxygen Therapy: Patient remains intubated per anesthesia plan and Patient placed on Ventilator (see vital sign flow sheet for setting)  Post-op Assessment: Report given to RN and Post -op Vital signs reviewed and stable ABP 133/80 no pressors; spo2 100%   Post vital signs: Reviewed and stable  Last Vitals:  Vitals Value Taken Time  BP    Temp    Pulse    Resp    SpO2      Last Pain:  Vitals:   12/04/19 1010  TempSrc: Axillary  PainSc:          Complications: No apparent anesthesia complications
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
## 2019-12-04 NOTE — Progress Notes (Signed)
PT Cancellation Note  Patient Details Name: Paul Bender MRN: [id number] DOB: 1990-11-05   Cancelled Treatment:    Reason Eval/Treat Not Completed: Active bedrest order. Pt pending surgery for R acetabular fx. PT will hold until surgery is complete and pt is take off bedrest.   Arlyss Gandy 12/04/2019, 7:52 AM
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
## 2019-12-04 NOTE — Interval H&P Note (Signed)
History and Physical Interval Note:  12/04/2019 9:26 AM  Paul Bender  has presented today for surgery, with the diagnosis of acetabular fracture.  The various methods of treatment have been discussed with the patient and family. After consideration of risks, benefits and other options for treatment, the patient has consented to  Procedure(s): OPEN REDUCTION INTERNAL FIXATION (ORIF) ACETABULAR FRACTURE (Right) as a surgical intervention.  The patient's history has been reviewed, patient examined, no change in status, stable for surgery.  I have reviewed the patient's chart and labs.  Questions were answered to the patient's satisfaction.     Caryn Bee P Esma Kilts
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
## 2019-12-04 NOTE — Progress Notes (Signed)
OT Cancellation Note  Patient Details Name: [name] MRN: [id number] DOB: 11-02-1990   Cancelled Treatment:    Reason Eval/Treat Not Completed: (P) Active bedrest order  [facility] 12/04/2019, 9:13 AM  Luisa Dago, OT/L   Acute OT Clinical Specialist Acute Rehabilitation Services Pager [phone] Office [phone]
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
## 2019-12-04 NOTE — Progress Notes (Signed)
Assisted tele visit to patient with family member.  Waverly Tarquinio McEachran, RN  

## 2019-12-05 ENCOUNTER — Inpatient Hospital Stay (HOSPITAL_COMMUNITY): Payer: No Typology Code available for payment source

## 2019-12-05 ENCOUNTER — Encounter: Payer: Self-pay | Admitting: *Deleted

## 2019-12-05 LAB — PREPARE PLATELET PHERESIS
Unit division: 0
Unit division: 0

## 2019-12-05 LAB — BASIC METABOLIC PANEL
Anion gap: 7 (ref 5–15)
BUN: 11 mg/dL (ref 6–20)
CO2: 23 mmol/L (ref 22–32)
Calcium: 7.3 mg/dL — ABNORMAL LOW (ref 8.9–10.3)
Chloride: 113 mmol/L — ABNORMAL HIGH (ref 98–111)
Creatinine, Ser: 0.83 mg/dL (ref 0.61–1.24)
GFR calc Af Amer: >60 mL/min (ref >60)
GFR calc non Af Amer: >60 mL/min (ref >60)
Glucose, Bld: 97 mg/dL (ref 70–99)
Potassium: 3.7 mmol/L (ref 3.5–5.1)
Sodium: 143 mmol/L (ref 135–145)

## 2019-12-05 LAB — BPAM CRYOPRECIPITATE
Blood Product Expiration Date: 202105131854
Blood Product Expiration Date: 202105131854
ISSUE DATE / TIME: 202105131323
ISSUE DATE / TIME: 202105131323
Unit Type and Rh: 6200
Unit Type and Rh: 6200

## 2019-12-05 LAB — BPAM PLATELET PHERESIS
Blood Product Expiration Date: 202105152359
Blood Product Expiration Date: 202105152359
ISSUE DATE / TIME: 202105131239
ISSUE DATE / TIME: 202105131707
Unit Type and Rh: 7300
Unit Type and Rh: 6200

## 2019-12-05 LAB — POCT I-STAT 7, (LYTES, BLD GAS, ICA,H+H)
Acid-base deficit: 1 mmol/L (ref 0.0–2.0)
Acid-base deficit: 2 mmol/L (ref 0.0–2.0)
Acid-base deficit: 1 mmol/L (ref 0.0–2.0)
Acid-base deficit: 3 mmol/L — ABNORMAL HIGH (ref 0.0–2.0)
Bicarbonate: 23.7 mmol/L (ref 20.0–28.0)
Bicarbonate: 23 mmol/L (ref 20.0–28.0)
Bicarbonate: 24.5 mmol/L (ref 20.0–28.0)
Bicarbonate: 22 mmol/L (ref 20.0–28.0)
Calcium, Ion: 1.02 mmol/L — ABNORMAL LOW (ref 1.15–1.40)
Calcium, Ion: 0.96 mmol/L — ABNORMAL LOW (ref 1.15–1.40)
Calcium, Ion: 0.79 mmol/L — CL (ref 1.15–1.40)
Calcium, Ion: 0.95 mmol/L — ABNORMAL LOW (ref 1.15–1.40)
HCT: 17 % — ABNORMAL LOW (ref 39.0–52.0)
HCT: 17 % — ABNORMAL LOW (ref 39.0–52.0)
HCT: 22 % — ABNORMAL LOW (ref 39.0–52.0)
HCT: 30 % — ABNORMAL LOW (ref 39.0–52.0)
Hemoglobin: 5.8 g/dL — CL (ref 13.0–17.0)
Hemoglobin: 5.8 g/dL — CL (ref 13.0–17.0)
Hemoglobin: 7.5 g/dL — ABNORMAL LOW (ref 13.0–17.0)
Hemoglobin: 10.2 g/dL — ABNORMAL LOW (ref 13.0–17.0)
O2 Saturation: 100 %
O2 Saturation: 100 %
O2 Saturation: 100 %
O2 Saturation: 100 %
Patient temperature: 35
Potassium: 4.1 mmol/L (ref 3.5–5.1)
Potassium: 4.1 mmol/L (ref 3.5–5.1)
Potassium: 4.5 mmol/L (ref 3.5–5.1)
Potassium: 4.6 mmol/L (ref 3.5–5.1)
Sodium: 139 mmol/L (ref 135–145)
Sodium: 139 mmol/L (ref 135–145)
Sodium: 141 mmol/L (ref 135–145)
Sodium: 140 mmol/L (ref 135–145)
TCO2: 25 mmol/L (ref 22–32)
TCO2: 24 mmol/L (ref 22–32)
TCO2: 26 mmol/L (ref 22–32)
TCO2: 23 mmol/L (ref 22–32)
pCO2 arterial: 37.7 mmHg (ref 32.0–48.0)
pCO2 arterial: 39.4 mmHg (ref 32.0–48.0)
pCO2 arterial: 43.2 mmHg (ref 32.0–48.0)
pCO2 arterial: 35.1 mmHg (ref 32.0–48.0)
pH, Arterial: 7.406 (ref 7.350–7.450)
pH, Arterial: 7.374 (ref 7.350–7.450)
pH, Arterial: 7.361 (ref 7.350–7.450)
pH, Arterial: 7.396 (ref 7.350–7.450)
pO2, Arterial: 230 mmHg — ABNORMAL HIGH (ref 83.0–108.0)
pO2, Arterial: 307 mmHg — ABNORMAL HIGH (ref 83.0–108.0)
pO2, Arterial: 362 mmHg — ABNORMAL HIGH (ref 83.0–108.0)
pO2, Arterial: 311 mmHg — ABNORMAL HIGH (ref 83.0–108.0)

## 2019-12-05 LAB — CBC
HCT: 27.9 % — ABNORMAL LOW (ref 39.0–52.0)
Hemoglobin: 9.4 g/dL — ABNORMAL LOW (ref 13.0–17.0)
MCH: 28.7 pg (ref 26.0–34.0)
MCHC: 33.7 g/dL (ref 30.0–36.0)
MCV: 85.3 fL (ref 80.0–100.0)
Platelets: 71 10*3/uL — ABNORMAL LOW (ref 150–400)
RBC: 3.27 MIL/uL — ABNORMAL LOW (ref 4.22–5.81)
RDW: 15.9 % — ABNORMAL HIGH (ref 11.5–15.5)
WBC: 6 10*3/uL (ref 4.0–10.5)
nRBC: 0 % (ref 0.0–0.2)

## 2019-12-05 LAB — PREPARE CRYOPRECIPITATE
Unit division: 0
Unit division: 0

## 2019-12-05 LAB — BPAM FFP
Blood Product Expiration Date: 202105162359
Blood Product Expiration Date: 202105162359
ISSUE DATE / TIME: 202105131239
ISSUE DATE / TIME: 202105131239
Unit Type and Rh: 6200
Unit Type and Rh: 6200

## 2019-12-05 LAB — PREPARE FRESH FROZEN PLASMA

## 2019-12-05 LAB — TRIGLYCERIDES: Triglycerides: 104 mg/dL (ref <150)

## 2019-12-05 LAB — GLUCOSE, CAPILLARY: Glucose-Capillary: 89 mg/dL (ref 70–99)

## 2019-12-05 MED ORDER — VITAMIN D 25 MCG (1000 UNIT) PO TABS: 2000.0000 [IU] | ORAL_TABLET | Freq: Every day | ORAL | Status: DC

## 2019-12-05 MED ORDER — ACETAMINOPHEN 500 MG PO TABS: 1000.0000 mg | ORAL_TABLET | Freq: Four times a day (QID) | ORAL | Status: DC

## 2019-12-05 MED ORDER — CALCIUM GLUCONATE-NACL 2-0.675 GM/100ML-% IV SOLN
2.0000 g | Freq: Once | INTRAVENOUS | Status: AC
  Administered 2019-12-05: 2000 mg via INTRAVENOUS
  Filled 2019-12-05: qty 100

## 2019-12-05 MED ORDER — LORAZEPAM 2 MG/ML IJ SOLN: 1.0000 mg | INTRAMUSCULAR | Status: DC | PRN

## 2019-12-05 MED ORDER — POLYETHYLENE GLYCOL 3350 17 G PO PACK: 17.0000 g | PACK | Freq: Every day | ORAL | Status: DC | PRN

## 2019-12-05 MED ORDER — OXYCODONE HCL 5 MG/5ML PO SOLN
5.0000 mg | ORAL | Status: DC | PRN
  Administered 2019-12-05 – 2019-12-06 (×2): 5 mg via ORAL
  Administered 2019-12-06 – 2019-12-10 (×4): 10 mg via ORAL
  Administered 2019-12-10: 5 mg via ORAL
  Administered 2019-12-14 – 2019-12-20 (×3): 10 mg via ORAL
  Administered 2019-12-20: 5 mg via ORAL
  Administered 2019-12-20 – 2019-12-23 (×2): 10 mg via ORAL
  Administered 2019-12-24: 5 mg via ORAL
  Administered 2019-12-25 – 2019-12-27 (×4): 10 mg via ORAL
  Administered 2019-12-27: 5 mg via ORAL
  Administered 2019-12-27 – 2020-01-05 (×22): 10 mg via ORAL
  Administered 2020-01-06: 5 mg via ORAL
  Administered 2020-01-06 – 2020-01-09 (×9): 10 mg via ORAL
  Filled 2019-12-05 (×3): qty 10
  Filled 2019-12-05: qty 5
  Filled 2019-12-05 (×7): qty 10
  Filled 2019-12-05: qty 5
  Filled 2019-12-05 (×4): qty 10
  Filled 2019-12-05: qty 5
  Filled 2019-12-05 (×9): qty 10
  Filled 2019-12-05: qty 5
  Filled 2019-12-05 (×2): qty 10
  Filled 2019-12-05: qty 5
  Filled 2019-12-05 (×16): qty 10
  Filled 2019-12-05: qty 5
  Filled 2019-12-05 (×5): qty 10
  Filled 2019-12-05 (×2): qty 5
  Filled 2019-12-05: qty 10
  Filled 2019-12-05: qty 5
  Filled 2019-12-05: qty 10

## 2019-12-05 MED ORDER — BACITRACIN ZINC 500 UNIT/GM EX OINT
TOPICAL_OINTMENT | Freq: Two times a day (BID) | CUTANEOUS | Status: DC
  Administered 2019-12-05 – 2019-12-28 (×7): 1 via TOPICAL
  Filled 2019-12-05 (×2): qty 28.4

## 2019-12-05 MED ORDER — SODIUM CHLORIDE 0.9 % IV SOLN
2.0000 g | INTRAVENOUS | Status: AC
  Administered 2019-12-05 – 2019-12-07 (×3): 2 g via INTRAVENOUS
  Filled 2019-12-05 (×2): qty 20
  Filled 2019-12-05: qty 2
  Filled 2019-12-05: qty 20

## 2019-12-05 MED ORDER — METHOCARBAMOL 1000 MG/10ML IJ SOLN
1000.0000 mg | Freq: Three times a day (TID) | INTRAVENOUS | Status: DC | PRN
  Filled 2019-12-05: qty 10

## 2019-12-05 MED ORDER — ACETAMINOPHEN 160 MG/5ML PO SOLN
1000.0000 mg | Freq: Four times a day (QID) | ORAL | Status: DC
  Administered 2019-12-05 – 2019-12-31 (×45): 1000 mg via ORAL
  Filled 2019-12-05 (×50): qty 40.6

## 2019-12-05 MED ORDER — ACETAMINOPHEN 500 MG PO TABS
1000.0000 mg | ORAL_TABLET | Freq: Four times a day (QID) | ORAL | Status: DC
  Administered 2019-12-19 – 2020-01-09 (×50): 1000 mg via ORAL
  Filled 2019-12-05 (×56): qty 2

## 2019-12-05 MED ORDER — FENTANYL CITRATE (PF) 100 MCG/2ML IJ SOLN: 50.0000 ug | INTRAMUSCULAR | Status: DC | PRN

## 2019-12-05 MED ORDER — CALCIUM GLUCONATE-NACL 1-0.675 GM/50ML-% IV SOLN: 1.0000 g | Freq: Once | INTRAVENOUS | Status: DC

## 2019-12-05 NOTE — Anesthesia Postprocedure Evaluation (Signed)
Anesthesia Post Note  Patient: Paul Bender  Procedure(s) Performed: OPEN REDUCTION INTERNAL FIXATION (ORIF) ACETABULAR FRACTURE W/ DRESSING CHANGE LEFT LEG (Right Shoulder)     Patient location during evaluation: NICU Anesthesia Type: General Level of consciousness: sedated and patient remains intubated per anesthesia plan Pain management: pain level controlled Vital Signs Assessment: post-procedure vital signs reviewed and stable Respiratory status: patient remains intubated per anesthesia plan and patient on ventilator - see flowsheet for VS Cardiovascular status: stable Postop Assessment: no apparent nausea or vomiting Anesthetic complications: no    Last Vitals:  Vitals:   12/05/19 1500 12/05/19 1600  BP: (!) 152/101   Pulse:    Resp: 19   Temp:  36.9 C  SpO2:      Last Pain:  Vitals:   12/05/19 1600  TempSrc: Axillary  PainSc:                  Atira Borello COKER

## 2019-12-05 NOTE — Progress Notes (Signed)
OT Cancellation Note  Patient Details Name: Paul Bender MRN: [id number] DOB: [birth year]   Cancelled Treatment:    Reason Eval/Treat Not Completed: Patient not medically ready;Active bedrest order Therapy orders to start after extubation. OT to hold until patient extubated  Wynona Neat, OTR/L  Acute Rehabilitation Services Pager: [phone] Office: [phone] .  12/05/2019, 8:01 AM
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
## 2019-12-05 NOTE — Progress Notes (Addendum)
  Progress Note    12/05/2019 7:38 AM 1 Day Post-Op  Subjective:  Intubated-waking up  Afebrile 80's-130's systolic HR 60's-80's 100% .66ZLD3   Gtts: Propofol Fentanyl Dexmedetomidine   Vitals:   12/05/19 0600 12/05/19 0700  BP: 121/73 131/70  Pulse: 77 83  Resp: 16 18  Temp:    SpO2: 100% 100%    Physical Exam: General:  Intubated and waking up Incisions:  Bandage in place with mild bloody ooze on dressing Extremities:  LLE with ace wrap in place   CBC    Component Value Date/Time   WBC 6.0 12/05/2019 0504   RBC 3.27 (L) 12/05/2019 0504   HGB 9.4 (L) 12/05/2019 0504   HCT 27.9 (L) 12/05/2019 0504   PLT 71 (L) 12/05/2019 0504   MCV 85.3 12/05/2019 0504   MCH 28.7 12/05/2019 0504   MCHC 33.7 12/05/2019 0504   RDW 15.9 (H) 12/05/2019 0504    BMET    Component Value Date/Time   NA 143 12/05/2019 0504   K 3.7 12/05/2019 0504   CL 113 (H) 12/05/2019 0504   CO2 23 12/05/2019 0504   GLUCOSE 97 12/05/2019 0504   BUN 11 12/05/2019 0504   CREATININE 0.83 12/05/2019 0504   CALCIUM 7.3 (L) 12/05/2019 0504   GFRNONAA >60 12/05/2019 0504   GFRAA >60 12/05/2019 0504    INR    Component Value Date/Time   INR 1.2 12/04/2019 1955     Intake/Output Summary (Last 24 hours) at 12/05/2019 0738 Last data filed at 12/05/2019 0600 Gross per 24 hour  Intake 10855.01 ml  Output 3630 ml  Net 7225.01 ml     Assessment:  29 y.o. male is s/p:  Primary repair of bleeding pelvic vein  1 Day Post-Op  Plan: -acute blood loss anemia:  hgb down slightly from last evening-overall appears stable since reveiving blood products, pt received 13 units PRBC's,  2 cryo, 2 units FFP and 2 platelet phoresis packs -thrombocytopenia at 71k stable from last evening    Doreatha Massed, PA-C [facility] [phone] 12/05/2019 7:38 AM   I have independently interviewed patient and agree with PA assessment and plan above.  Vascular will available as  needed.  Jeramine Delis C. Randie Heinz, MD [facility] [place] Office: [phone] Pager: [phone]

## 2019-12-05 NOTE — Plan of Care (Signed)
  Problem: Clinical Measurements: Goal: Ability to maintain clinical measurements within normal limits will improve Outcome: Progressing   
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
## 2019-12-05 NOTE — Anesthesia Postprocedure Evaluation (Signed)
Anesthesia Post Note  Patient: JEFRY LESINSKI  Procedure(s) Performed: INSERTION OF TRACTION PIN (Right ) IRRIGATION AND DEBRIDEMENT EXTREMITY (Left Leg Lower) INTRAMEDULLARY (IM) NAIL TIBIAL (Left Leg Lower) MANDIBULAR-MAXILLARY FIXATION (N/A Face) CLOSURE SCALP LACERATION (Right Face)     Patient location during evaluation: SICU Anesthesia Type: General Level of consciousness: sedated Pain management: pain level controlled Vital Signs Assessment: post-procedure vital signs reviewed and stable Respiratory status: patient remains intubated per anesthesia plan Cardiovascular status: stable Postop Assessment: no apparent nausea or vomiting Anesthetic complications: no    Last Vitals:  Vitals:   12/05/19 0900 12/05/19 1000  BP: (!) 110/52 125/75  Pulse: 95 75  Resp: 17 15  Temp:    SpO2: 100% 100%    Last Pain:  Vitals:   12/05/19 0800  TempSrc: Axillary  PainSc:                  Halen Antenucci
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
## 2019-12-05 NOTE — Progress Notes (Signed)
Assisted tele visit to patient with family members.  Arsen Mangione Anderson, RN   
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
## 2019-12-06 ENCOUNTER — Inpatient Hospital Stay (HOSPITAL_COMMUNITY): Payer: No Typology Code available for payment source

## 2019-12-06 LAB — BASIC METABOLIC PANEL
Anion gap: 7 (ref 5–15)
BUN: <5 mg/dL — ABNORMAL LOW (ref 6–20)
CO2: 24 mmol/L (ref 22–32)
Calcium: 7.8 mg/dL — ABNORMAL LOW (ref 8.9–10.3)
Chloride: 107 mmol/L (ref 98–111)
Creatinine, Ser: 0.67 mg/dL (ref 0.61–1.24)
GFR calc Af Amer: >60 mL/min (ref >60)
GFR calc non Af Amer: >60 mL/min (ref >60)
Glucose, Bld: 89 mg/dL (ref 70–99)
Potassium: 3.1 mmol/L — ABNORMAL LOW (ref 3.5–5.1)
Sodium: 138 mmol/L (ref 135–145)

## 2019-12-06 LAB — TRIGLYCERIDES: Triglycerides: 110 mg/dL (ref <150)

## 2019-12-06 LAB — CBC
HCT: 28.5 % — ABNORMAL LOW (ref 39.0–52.0)
Hemoglobin: 9.4 g/dL — ABNORMAL LOW (ref 13.0–17.0)
MCH: 29.3 pg (ref 26.0–34.0)
MCHC: 33 g/dL (ref 30.0–36.0)
MCV: 88.8 fL (ref 80.0–100.0)
Platelets: 102 10*3/uL — ABNORMAL LOW (ref 150–400)
RBC: 3.21 MIL/uL — ABNORMAL LOW (ref 4.22–5.81)
RDW: 15.9 % — ABNORMAL HIGH (ref 11.5–15.5)
WBC: 9.3 10*3/uL (ref 4.0–10.5)
nRBC: 0 % (ref 0.0–0.2)

## 2019-12-06 MED ORDER — CHLORHEXIDINE GLUCONATE 0.12 % MT SOLN
15.0000 mL | Freq: Two times a day (BID) | OROMUCOSAL | Status: DC
  Administered 2019-12-06 – 2020-01-09 (×48): 15 mL via OROMUCOSAL
  Filled 2019-12-06 (×48): qty 15

## 2019-12-06 MED ORDER — POTASSIUM CHLORIDE 20 MEQ/15ML (10%) PO SOLN
40.0000 meq | ORAL | Status: AC
  Administered 2019-12-06 – 2019-12-07 (×3): 40 meq via ORAL
  Filled 2019-12-06 (×3): qty 30

## 2019-12-06 MED ORDER — DOCUSATE SODIUM 50 MG/5ML PO LIQD
100.0000 mg | Freq: Two times a day (BID) | ORAL | Status: DC
  Administered 2019-12-07 – 2020-01-09 (×55): 100 mg via ORAL
  Filled 2019-12-06 (×64): qty 10

## 2019-12-06 MED ORDER — FUROSEMIDE 10 MG/ML IJ SOLN
40.0000 mg | Freq: Once | INTRAMUSCULAR | Status: AC
  Administered 2019-12-06: 40 mg via INTRAVENOUS
  Filled 2019-12-06: qty 4

## 2019-12-06 MED ORDER — CHOLECALCIFEROL 10 MCG/ML (400 UNIT/ML) PO LIQD
2000.0000 [IU] | Freq: Every day | ORAL | Status: DC
  Administered 2019-12-06 – 2019-12-08 (×2): 2000 [IU]
  Filled 2019-12-06 (×4): qty 5

## 2019-12-06 MED ORDER — POLYETHYLENE GLYCOL 3350 17 G PO PACK: 17.0000 g | PACK | Freq: Every day | ORAL | Status: DC | PRN

## 2019-12-06 MED ORDER — ORAL CARE MOUTH RINSE
15.0000 mL | Freq: Two times a day (BID) | OROMUCOSAL | Status: DC
  Administered 2019-12-06 – 2020-01-09 (×30): 15 mL via OROMUCOSAL

## 2019-12-06 MED ORDER — METHOCARBAMOL 500 MG PO TABS
1000.0000 mg | ORAL_TABLET | Freq: Three times a day (TID) | ORAL | Status: DC
  Administered 2019-12-07 – 2020-01-09 (×75): 1000 mg via ORAL
  Filled 2019-12-06 (×87): qty 2

## 2019-12-06 MED ORDER — ENOXAPARIN SODIUM 30 MG/0.3ML ~~LOC~~ SOLN
30.0000 mg | Freq: Two times a day (BID) | SUBCUTANEOUS | Status: DC
  Administered 2019-12-06 – 2020-01-09 (×60): 30 mg via SUBCUTANEOUS
  Filled 2019-12-06 (×67): qty 0.3

## 2019-12-06 NOTE — Progress Notes (Addendum)
Inpatient Rehab Admissions Coordinator:   Met with patient at bedside to discuss potential CIR admission. Pt. Stated interest. Will pursue for potential admit next week, pending bed availability. Will need to be cleared by psychiatry prior to CIR admission.   Clemens Catholic, [place], [place] Admissions Coordinator  [phone] ([place]) [phone] (office)

## 2019-12-06 NOTE — Progress Notes (Signed)
Assisted tele visit to patient with family member.  Kleo Dungee Ann, RN  
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
## 2019-12-06 NOTE — Progress Notes (Signed)
SPORTS MEDICINE AND JOINT REPLACEMENT  Georgena Spurling, MD    Laurier Nancy, PA-C [address] [place], [place], [place]  [address]                             [phone]   PROGRESS NOTE  Subjective:  negative for Chest Pain  negative for Shortness of Breath  negative for Nausea/Vomiting   negative for Calf Pain  negative for Bowel Movement   Tolerating Diet: yes         Patient reports pain as 7 on 0-10 scale.    Objective: Vital signs in last 24 hours:    Patient Vitals for the past 24 hrs:  BP Temp Temp src Pulse Resp SpO2  12/06/19 0714 -- 98.2 F (36.8 C) Axillary -- -- --  12/06/19 0600 (!) 150/97 -- -- 93 (!) 24 95 %  12/06/19 0500 (!) 153/100 -- -- -- (!) 28 --  12/06/19 0400 (!) 159/103 -- -- -- (!) 28 --  12/06/19 0300 (!) 161/96 -- -- -- (!) 23 --  12/06/19 0200 (!) 164/98 -- -- -- (!) 26 --  12/06/19 0100 (!) 151/90 -- -- -- (!) 24 --  12/06/19 0025 -- 99.4 F (37.4 C) Axillary -- -- --  12/06/19 0000 (!) 154/94 -- -- -- (!) 29 98 %  12/05/19 2300 (!) 160/94 -- -- -- (!) 27 --  12/05/19 2200 (!) 159/95 -- -- -- (!) 28 --  12/05/19 2100 (!) 161/87 -- -- -- (!) 30 --  12/05/19 2042 -- 99.5 F (37.5 C) Axillary -- -- --  12/05/19 2000 (!) 145/97 -- -- -- (!) 26 --  12/05/19 1900 (!) 162/87 -- -- -- (!) 28 --  12/05/19 1600 -- 98.4 F (36.9 C) Axillary -- -- --  12/05/19 1500 (!) 152/101 -- -- -- 19 --  12/05/19 1400 (!) 152/104 -- -- 74 18 98 %  12/05/19 1300 (!) 143/93 -- -- 83 18 96 %  12/05/19 1200 (!) 134/53 98.4 F (36.9 C) Axillary 72 16 96 %  12/05/19 1100 124/73 -- -- 74 14 100 %  12/05/19 1000 125/75 -- -- 75 15 100 %  12/05/19 0900 (!) 110/52 -- -- 95 17 100 %    @flow {1959:LAST@   Intake/Output from previous day:   05/14 0701 - 05/15 0700 In: 2944.8 [P.O.:235; I.V.:2481.5] Out: 1575 [Urine:1575]   Intake/Output this shift:   No intake/output data recorded.   Intake/Output      05/14 0701 - 05/15 0700 05/15 0701 - 05/16 0700   P.O.  235    I.V. (mL/kg) 2481.5 (32.2)    Blood     IV Piggyback 228.3    Total Intake(mL/kg) 2944.8 (38.2)    Urine (mL/kg/hr) 1575 (0.9)    Drains 0    Blood     Total Output 1575    Net +1369.8            LABORATORY DATA: Recent Labs    12/03/19 0756 12/03/19 0807 12/03/19 2104 12/03/19 2104 12/04/19 0500 12/04/19 0500 12/04/19 1123 12/04/19 1242 12/04/19 1413 12/04/19 1556 12/04/19 1955 12/05/19 0504 12/06/19 0512  WBC 8.0  --  7.5  --  9.0  --   --   --   --   --  5.7 6.0 9.3  HGB 10.9*   < > 8.8*   < > 7.2*   < > 5.8* 5.8* 7.5* 10.2* 10.3* 9.4*  9.4*  HCT 33.1*   < > 25.6*   < > 21.6*   < > 17.0* 17.0* 22.0* 30.0* 30.0* 27.9* 28.5*  PLT 132*  --  65*  --  64*  --   --   --   --   --  70* 71* 102*   < > = values in this interval not displayed.   Recent Labs    12/03/19 0756 12/03/19 0756 12/03/19 0807 12/03/19 1100 12/03/19 2104 12/03/19 2104 12/04/19 0500 12/04/19 0500 12/04/19 1123 12/04/19 1242 12/04/19 1413 12/04/19 1556 12/04/19 1955 12/05/19 0504 12/06/19 0512  NA 137   < > 137   < > 137   < > 138   < > 139 139 141 140 141 143 138  K 3.7   < > 3.6   < > 3.7   < > 3.9   < > 4.1 4.1 4.5 4.6 4.0 3.7 3.1*  CL 105  --  103  --  108  --  109  --   --   --   --   --  113* 113* 107  CO2 21*  --   --   --  21*  --  21*  --   --   --   --   --  22 23 24   BUN 12  --  14  --  12  --  13  --   --   --   --   --  11 11 <5*  CREATININE 1.09  --  1.00  --  0.96  --  1.01  --   --   --   --   --  0.83 0.83 0.67  GLUCOSE 152*  --  140*  --  139*  --  136*  --   --   --   --   --  122* 97 89  CALCIUM 8.3*  --   --   --  7.5*  --  7.3*  --   --   --   --   --  7.2* 7.3* 7.8*   < > = values in this interval not displayed.   Lab Results  Component Value Date   INR 1.2 12/04/2019   INR 1.0 12/03/2019    Examination:  General appearance: alert, cooperative and no distress Extremities: extremities normal, atraumatic, no cyanosis or edema  Wound Exam: draining    Drainage:  Moderate amount Serosanguinous exudate  Motor Exam: Quadriceps and Hamstrings Intact  Sensory Exam: Superficial Peroneal and Deep Peroneal normal   Assessment:    2 Days Post-Op  Procedure(s) (LRB): OPEN REDUCTION INTERNAL FIXATION (ORIF) ACETABULAR FRACTURE W/ DRESSING CHANGE LEFT LEG (Right)  ADDITIONAL DIAGNOSIS:  Active Problems:   Acetabulum fracture, right (HCC)   Type III open displaced segmental fracture of shaft of left tibia     Plan: Physical Therapy as ordered Non Weight Bearing (NWB)  Dressings will be changed by RN due to drainage, will readdress tomorrow and remove if able. Leave wound vac in place. Patient has 24 hr sitter. Will continue to follow through weekend   Donia Ast 12/06/2019, 8:40 AM
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
## 2019-12-06 NOTE — Progress Notes (Signed)
Inpatient Rehab Admissions Coordinator Note:   Per therapy recommendations, pt was screened for CIR candidacy by Ara Grandmaison, MS CCC-SLP. At this time, Pt. Appears to have functional decline and is a good candidate for CIR. Will place order for rehab consult per protocol.  Please contact me with questions.   Leonard Hendler, MS, CCC-SLP Rehab Admissions Coordinator  [phone] (celll) [phone] (office)  

## 2019-12-07 LAB — TYPE AND SCREEN
ABO/RH(D): A POS
Antibody Screen: NEGATIVE
Unit division: 0
Unit division: 0
Unit division: 0
Unit division: 0
Unit division: 0
Unit division: 0
Unit division: 0
Unit division: 0
Unit division: 0
Unit division: 0
Unit division: 0
Unit division: 0
Unit division: 0
Unit division: 0
Unit division: 0

## 2019-12-07 LAB — BPAM RBC
Blood Product Expiration Date: 202106092359
Blood Product Expiration Date: 202106092359
Blood Product Expiration Date: 202106092359
Blood Product Expiration Date: 202106102359
Blood Product Expiration Date: 202106102359
Blood Product Expiration Date: 202106102359
Blood Product Expiration Date: 202106102359
Blood Product Expiration Date: 202106102359
Blood Product Expiration Date: 202106102359
Blood Product Expiration Date: 202106102359
Blood Product Expiration Date: 202106102359
Blood Product Expiration Date: 202106102359
Blood Product Expiration Date: 202106102359
Blood Product Expiration Date: 202106102359
Blood Product Expiration Date: 202106142359
ISSUE DATE / TIME: 202105120939
ISSUE DATE / TIME: 202105120952
ISSUE DATE / TIME: 202105121541
ISSUE DATE / TIME: 202105121541
ISSUE DATE / TIME: 202105130842
ISSUE DATE / TIME: 202105131130
ISSUE DATE / TIME: 202105131130
ISSUE DATE / TIME: 202105131250
ISSUE DATE / TIME: 202105131250
ISSUE DATE / TIME: 202105131253
ISSUE DATE / TIME: 202105131253
ISSUE DATE / TIME: 202105131428
ISSUE DATE / TIME: 202105131428
ISSUE DATE / TIME: 202105131428
ISSUE DATE / TIME: 202105131428
Unit Type and Rh: 5100
Unit Type and Rh: 5100
Unit Type and Rh: 6200
Unit Type and Rh: 6200
Unit Type and Rh: 6200
Unit Type and Rh: 6200
Unit Type and Rh: 6200
Unit Type and Rh: 6200
Unit Type and Rh: 6200
Unit Type and Rh: 6200
Unit Type and Rh: 6200
Unit Type and Rh: 6200
Unit Type and Rh: 6200
Unit Type and Rh: 6200
Unit Type and Rh: 6200

## 2019-12-07 LAB — BASIC METABOLIC PANEL
Anion gap: 11 (ref 5–15)
BUN: 7 mg/dL (ref 6–20)
CO2: 29 mmol/L (ref 22–32)
Calcium: 9.1 mg/dL (ref 8.9–10.3)
Chloride: 97 mmol/L — ABNORMAL LOW (ref 98–111)
Creatinine, Ser: 0.89 mg/dL (ref 0.61–1.24)
GFR calc Af Amer: >60 mL/min (ref >60)
GFR calc non Af Amer: >60 mL/min (ref >60)
Glucose, Bld: 121 mg/dL — ABNORMAL HIGH (ref 70–99)
Potassium: 3.3 mmol/L — ABNORMAL LOW (ref 3.5–5.1)
Sodium: 137 mmol/L (ref 135–145)

## 2019-12-07 LAB — CBC
HCT: 34.3 % — ABNORMAL LOW (ref 39.0–52.0)
Hemoglobin: 11.2 g/dL — ABNORMAL LOW (ref 13.0–17.0)
MCH: 28.8 pg (ref 26.0–34.0)
MCHC: 32.7 g/dL (ref 30.0–36.0)
MCV: 88.2 fL (ref 80.0–100.0)
Platelets: 181 10*3/uL (ref 150–400)
RBC: 3.89 MIL/uL — ABNORMAL LOW (ref 4.22–5.81)
RDW: 15.5 % (ref 11.5–15.5)
WBC: 14.1 10*3/uL — ABNORMAL HIGH (ref 4.0–10.5)
nRBC: 0 % (ref 0.0–0.2)

## 2019-12-07 LAB — MAGNESIUM: Magnesium: 1.9 mg/dL (ref 1.7–2.4)

## 2019-12-07 LAB — PHOSPHORUS: Phosphorus: 3.2 mg/dL (ref 2.5–4.6)

## 2019-12-07 MED ORDER — ONDANSETRON HCL 4 MG PO TABS: 4.0000 mg | ORAL_TABLET | ORAL | Status: DC | PRN

## 2019-12-07 MED ORDER — FUROSEMIDE 10 MG/ML IJ SOLN
40.0000 mg | Freq: Once | INTRAMUSCULAR | Status: AC
  Administered 2019-12-07: 40 mg via INTRAVENOUS
  Filled 2019-12-07: qty 4

## 2019-12-07 MED ORDER — ONDANSETRON HCL 4 MG/2ML IJ SOLN
4.0000 mg | INTRAMUSCULAR | Status: DC | PRN
  Administered 2019-12-07 (×3): 4 mg via INTRAVENOUS
  Filled 2019-12-07 (×4): qty 2
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
## 2019-12-07 NOTE — Progress Notes (Signed)
Patient resting in bed. Sitter at bedside. Patient denies any nausea or pain. Declined oral medications. No acute needs voiced at this time, call bell in reach. Monitoring.

## 2019-12-07 NOTE — Progress Notes (Signed)
SPORTS MEDICINE AND JOINT REPLACEMENT  Lara Mulch, MD    Carlyon Shadow, PA-C [place] [place], [place], [place]  [address]                             [phone]   PROGRESS NOTE  Subjective:  negative for Chest Pain  negative for Shortness of Breath  negative for Nausea/Vomiting   negative for Calf Pain  negative for Bowel Movement   Tolerating Diet: yes         Patient reports pain as 5 on 0-10 scale.    Objective: Vital signs in last 24 hours:    Patient Vitals for the past 24 hrs:  BP Temp Temp src Pulse Resp SpO2  12/07/19 0401 (!) 164/111 98.8 F (37.1 C) Axillary 73 (!) 26 94 %  12/07/19 0000 (!) 150/85 99.3 F (37.4 C) Axillary 64 12 97 %  12/06/19 2057 (!) 161/102 -- -- -- -- --  12/06/19 2015 (!) 168/107 99 F (37.2 C) Axillary 66 (!) 22 98 %  12/06/19 1800 (!) 173/116 -- -- 74 (!) 22 100 %  12/06/19 1700 (!) 175/120 -- -- 85 14 100 %  12/06/19 1600 (!) 164/118 -- -- 88 (!) 22 100 %  12/06/19 1558 (!) 170/110 97.6 F (36.4 C) Axillary -- -- --  12/06/19 1500 (!) 167/111 -- -- 78 (!) 23 99 %  12/06/19 1400 (!) 170/116 -- -- 86 (!) 21 100 %  12/06/19 1300 (!) 163/114 -- -- 79 (!) 24 98 %  12/06/19 1200 (!) 171/122 -- -- 69 (!) 22 100 %  12/06/19 1149 -- 98.1 F (36.7 C) Axillary -- -- --  12/06/19 1100 (!) 173/106 -- -- 85 (!) 23 99 %  12/06/19 1000 (!) 168/102 -- -- 80 (!) 27 96 %  12/06/19 0900 (!) 163/102 -- -- 82 (!) 29 97 %  12/06/19 0800 (!) 160/100 -- -- 92 (!) 25 96 %    @flow {1959:LAST@   Intake/Output from previous day:   05/15 0701 - 05/16 0700 In: 1625.8 [P.O.:1020; I.V.:605.8] Out: 4132 [Urine:6100]   Intake/Output this shift:   No intake/output data recorded.   Intake/Output      05/15 0701 - 05/16 0700 05/16 0701 - 05/17 0700   P.O. 1020    I.V. (mL/kg) 605.8 (7.9)    IV Piggyback     Total Intake(mL/kg) 1625.8 (21.1)    Urine (mL/kg/hr) 6100 (3.3)    Emesis/NG output 1050    Drains 0    Total Output 7150    Net -5524.2             LABORATORY DATA: Recent Labs    12/03/19 0756 12/03/19 0807 12/03/19 2104 12/03/19 2104 12/04/19 0500 12/04/19 1123 12/04/19 1242 12/04/19 1413 12/04/19 1556 12/04/19 1955 12/05/19 0504 12/06/19 0512 12/07/19 0441  WBC 8.0  --  7.5  --  9.0  --   --   --   --  5.7 6.0 9.3 14.1*  HGB 10.9*   < > 8.8*   < > 7.2*   < > 5.8* 7.5* 10.2* 10.3* 9.4* 9.4* 11.2*  HCT 33.1*   < > 25.6*   < > 21.6*   < > 17.0* 22.0* 30.0* 30.0* 27.9* 28.5* 34.3*  PLT 132*  --  65*  --  64*  --   --   --   --  70* 71* 102* 181   < > =  values in this interval not displayed.   Recent Labs    12/03/19 0756 12/03/19 0756 12/03/19 0807 12/03/19 1100 12/03/19 2104 12/03/19 2104 12/04/19 0500 12/04/19 1123 12/04/19 1242 12/04/19 1413 12/04/19 1556 12/04/19 1955 12/05/19 0504 12/06/19 0512 12/07/19 0441  NA 137   < > 137   < > 137   < > 138   < > 139 141 140 141 143 138 137  K 3.7   < > 3.6   < > 3.7   < > 3.9   < > 4.1 4.5 4.6 4.0 3.7 3.1* 3.3*  CL 105   < > 103  --  108  --  109  --   --   --   --  113* 113* 107 97*  CO2 21*  --   --   --  21*  --  21*  --   --   --   --  22 23 24 29   BUN 12   < > 14  --  12  --  13  --   --   --   --  11 11 <5* 7  CREATININE 1.09   < > 1.00  --  0.96  --  1.01  --   --   --   --  0.83 0.83 0.67 0.89  GLUCOSE 152*   < > 140*  --  139*  --  136*  --   --   --   --  122* 97 89 121*  CALCIUM 8.3*  --   --   --  7.5*  --  7.3*  --   --   --   --  7.2* 7.3* 7.8* 9.1   < > = values in this interval not displayed.   Lab Results  Component Value Date   INR 1.2 12/04/2019   INR 1.0 12/03/2019    Examination:  General appearance: alert, cooperative and no distress Extremities: extremities normal, atraumatic, no cyanosis or edema  Wound Exam: clean, dry, intact   Drainage:  Scant/small amount Serosanguinous exudate  Motor Exam: Quadriceps and Hamstrings Intact  Sensory Exam: Superficial Peroneal, Deep Peroneal and Tibial normal   Assessment:     3 Days Post-Op  Procedure(s) (LRB): OPEN REDUCTION INTERNAL FIXATION (ORIF) ACETABULAR FRACTURE W/ DRESSING CHANGE LEFT LEG (Right)  ADDITIONAL DIAGNOSIS:  Active Problems:   Acetabulum fracture, right (HCC)   Type III open displaced segmental fracture of shaft of left tibia     Plan: Physical Therapy as ordered Non Weight Bearing (NWB)  I removed the mepilex bandages around the pelvis. No additional drainage noted. Wound vac still in place. Will continue to follow through the weekend        02/02/2020 12/07/2019, 7:30 AM

## 2019-12-08 ENCOUNTER — Encounter: Payer: Self-pay | Admitting: *Deleted

## 2019-12-08 LAB — BASIC METABOLIC PANEL
Anion gap: 11 (ref 5–15)
BUN: 14 mg/dL (ref 6–20)
CO2: 28 mmol/L (ref 22–32)
Calcium: 8.5 mg/dL — ABNORMAL LOW (ref 8.9–10.3)
Chloride: 98 mmol/L (ref 98–111)
Creatinine, Ser: 0.8 mg/dL (ref 0.61–1.24)
GFR calc Af Amer: >60 mL/min (ref >60)
GFR calc non Af Amer: >60 mL/min (ref >60)
Glucose, Bld: 128 mg/dL — ABNORMAL HIGH (ref 70–99)
Potassium: 3.3 mmol/L — ABNORMAL LOW (ref 3.5–5.1)
Sodium: 137 mmol/L (ref 135–145)

## 2019-12-08 LAB — MAGNESIUM: Magnesium: 1.8 mg/dL (ref 1.7–2.4)

## 2019-12-08 LAB — CBC
HCT: 29.9 % — ABNORMAL LOW (ref 39.0–52.0)
Hemoglobin: 9.9 g/dL — ABNORMAL LOW (ref 13.0–17.0)
MCH: 29.1 pg (ref 26.0–34.0)
MCHC: 33.1 g/dL (ref 30.0–36.0)
MCV: 87.9 fL (ref 80.0–100.0)
Platelets: 186 10*3/uL (ref 150–400)
RBC: 3.4 MIL/uL — ABNORMAL LOW (ref 4.22–5.81)
RDW: 15 % (ref 11.5–15.5)
WBC: 10.9 10*3/uL — ABNORMAL HIGH (ref 4.0–10.5)
nRBC: 0 % (ref 0.0–0.2)

## 2019-12-08 LAB — PHOSPHORUS: Phosphorus: 3.3 mg/dL (ref 2.5–4.6)

## 2019-12-08 MED ORDER — ENSURE ENLIVE PO LIQD
237.0000 mL | Freq: Four times a day (QID) | ORAL | Status: DC
  Administered 2019-12-08 – 2019-12-21 (×29): 237 mL via ORAL
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
## 2019-12-08 NOTE — Progress Notes (Signed)
Inpatient Rehab Admissions Coordinator Note:   CIR continues to follow patient. Pt. Not medically ready for admit at this time. Opened case with pt.'s insurance today for admit later this week, pending approval and bed availability.   Megan Salon, MS, CCC-SLP Rehab Admissions Coordinator  [phone] (celll) [phone] (office)
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
## 2019-12-08 NOTE — Addendum Note (Signed)
Addendum  created 12/08/19 1430 by Sonda Primes, CRNA   Order list changed
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
## 2019-12-09 LAB — CBC
HCT: 29.7 % — ABNORMAL LOW (ref 39.0–52.0)
Hemoglobin: 9.7 g/dL — ABNORMAL LOW (ref 13.0–17.0)
MCH: 29 pg (ref 26.0–34.0)
MCHC: 32.7 g/dL (ref 30.0–36.0)
MCV: 88.9 fL (ref 80.0–100.0)
Platelets: 221 10*3/uL (ref 150–400)
RBC: 3.34 MIL/uL — ABNORMAL LOW (ref 4.22–5.81)
RDW: 14.8 % (ref 11.5–15.5)
WBC: 8 10*3/uL (ref 4.0–10.5)
nRBC: 0 % (ref 0.0–0.2)

## 2019-12-09 LAB — BASIC METABOLIC PANEL
Anion gap: 10 (ref 5–15)
BUN: 12 mg/dL (ref 6–20)
CO2: 28 mmol/L (ref 22–32)
Calcium: 8.4 mg/dL — ABNORMAL LOW (ref 8.9–10.3)
Chloride: 97 mmol/L — ABNORMAL LOW (ref 98–111)
Creatinine, Ser: 0.74 mg/dL (ref 0.61–1.24)
GFR calc Af Amer: >60 mL/min (ref >60)
GFR calc non Af Amer: >60 mL/min (ref >60)
Glucose, Bld: 98 mg/dL (ref 70–99)
Potassium: 3.1 mmol/L — ABNORMAL LOW (ref 3.5–5.1)
Sodium: 135 mmol/L (ref 135–145)

## 2019-12-09 LAB — PHOSPHORUS: Phosphorus: 3.9 mg/dL (ref 2.5–4.6)

## 2019-12-09 LAB — MAGNESIUM: Magnesium: 1.8 mg/dL (ref 1.7–2.4)

## 2019-12-09 MED ORDER — AMLODIPINE BESYLATE 10 MG PO TABS
10.0000 mg | ORAL_TABLET | Freq: Every day | ORAL | Status: DC
  Administered 2019-12-10 – 2020-01-09 (×28): 10 mg via ORAL
  Filled 2019-12-09 (×29): qty 1

## 2019-12-09 MED ORDER — VITAMIN D 25 MCG (1000 UNIT) PO TABS
2000.0000 [IU] | ORAL_TABLET | Freq: Every day | ORAL | Status: DC
  Administered 2019-12-09 – 2020-01-09 (×28): 2000 [IU]
  Filled 2019-12-09 (×30): qty 2

## 2019-12-09 MED ORDER — POTASSIUM CHLORIDE CRYS ER 20 MEQ PO TBCR
40.0000 meq | EXTENDED_RELEASE_TABLET | Freq: Two times a day (BID) | ORAL | Status: AC
  Administered 2019-12-09 (×2): 40 meq via ORAL
  Filled 2019-12-09 (×2): qty 2

## 2019-12-09 MED ORDER — SERTRALINE HCL 50 MG PO TABS
25.0000 mg | ORAL_TABLET | Freq: Every day | ORAL | Status: DC
  Administered 2019-12-09 – 2019-12-10 (×2): 25 mg via ORAL
  Filled 2019-12-09 (×2): qty 1
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
## 2019-12-09 NOTE — Progress Notes (Addendum)
Inpatient Rehab Admissions Coordinator:   Recommended for inpatient psych.  I withdrew his case for authorization from [facility] today and we will follow from a distance.    Estill Dooms, PT, DPT Admissions Coordinator [phone] 12/09/19  10:39 AM

## 2019-12-09 NOTE — Addendum Note (Signed)
Addendum  created 12/09/19 0811 by Adair Laundry, CRNA   Order list changed
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
## 2019-12-10 LAB — CBC
HCT: 28.5 % — ABNORMAL LOW (ref 39.0–52.0)
Hemoglobin: 9.3 g/dL — ABNORMAL LOW (ref 13.0–17.0)
MCH: 28.7 pg (ref 26.0–34.0)
MCHC: 32.6 g/dL (ref 30.0–36.0)
MCV: 88 fL (ref 80.0–100.0)
Platelets: 263 10*3/uL (ref 150–400)
RBC: 3.24 MIL/uL — ABNORMAL LOW (ref 4.22–5.81)
RDW: 14.5 % (ref 11.5–15.5)
WBC: 10.6 10*3/uL — ABNORMAL HIGH (ref 4.0–10.5)
nRBC: 0 % (ref 0.0–0.2)

## 2019-12-10 LAB — BASIC METABOLIC PANEL
Anion gap: 9 (ref 5–15)
BUN: 13 mg/dL (ref 6–20)
CO2: 28 mmol/L (ref 22–32)
Calcium: 8.7 mg/dL — ABNORMAL LOW (ref 8.9–10.3)
Chloride: 97 mmol/L — ABNORMAL LOW (ref 98–111)
Creatinine, Ser: 0.75 mg/dL (ref 0.61–1.24)
GFR calc Af Amer: >60 mL/min (ref >60)
GFR calc non Af Amer: >60 mL/min (ref >60)
Glucose, Bld: 110 mg/dL — ABNORMAL HIGH (ref 70–99)
Potassium: 3.4 mmol/L — ABNORMAL LOW (ref 3.5–5.1)
Sodium: 134 mmol/L — ABNORMAL LOW (ref 135–145)

## 2019-12-10 MED ORDER — HALOPERIDOL 1 MG PO TABS
5.0000 mg | ORAL_TABLET | Freq: Two times a day (BID) | ORAL | Status: DC
  Administered 2019-12-10 – 2019-12-11 (×3): 5 mg via ORAL
  Filled 2019-12-10 (×4): qty 5

## 2019-12-10 MED ORDER — HYDROCHLOROTHIAZIDE 12.5 MG PO CAPS
12.5000 mg | ORAL_CAPSULE | Freq: Every day | ORAL | Status: DC
  Administered 2019-12-10 – 2019-12-16 (×5): 12.5 mg via ORAL
  Filled 2019-12-10 (×6): qty 1

## 2019-12-10 MED ORDER — HALOPERIDOL 5 MG PO TABS
5.0000 mg | ORAL_TABLET | Freq: Three times a day (TID) | ORAL | Status: DC | PRN
  Administered 2019-12-10: 5 mg via ORAL
  Filled 2019-12-10 (×2): qty 1
  Filled 2019-12-10: qty 5

## 2019-12-10 MED ORDER — POTASSIUM CHLORIDE CRYS ER 20 MEQ PO TBCR
40.0000 meq | EXTENDED_RELEASE_TABLET | Freq: Two times a day (BID) | ORAL | Status: AC
  Administered 2019-12-10 (×2): 40 meq via ORAL
  Filled 2019-12-10 (×2): qty 2
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
## 2019-12-10 NOTE — TOC Progression Note (Addendum)
Transition of Care [facility]) - Progression Note    Patient Details  Name: Paul Bender MRN: [id number] Date of Birth: 1990/10/15  [facility]) CM/SW Contact  Lawerance Sabal, RN Phone Number: 12/10/2019, 9:44 AM  Clinical Narrative:    Referred patient to  [facility] at [facility]  - declined, stating he needed more medical care than was appropriate for their facility Sarah at [facility] - pending And Vernona Rieger at [facility]- declined, stating he needed more medical care than was appropriate for their facility.           Expected Discharge Plan and Services                                                 Social Determinants of Health (SDOH) Interventions    Readmission Risk Interventions No flowsheet data found.
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
## 2019-12-10 NOTE — Progress Notes (Signed)
1595 - Pt is agitated, paranoid and is having delusions. Pt stated "Paul Bender are stopping time", "Yall are controlling my body", and "I'm going to die tomorrow". On call provider notified. No new orders received.
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
## 2019-12-11 ENCOUNTER — Encounter (HOSPITAL_COMMUNITY): Payer: No Typology Code available for payment source

## 2019-12-11 ENCOUNTER — Encounter (HOSPITAL_COMMUNITY): Payer: Self-pay

## 2019-12-11 LAB — COMPREHENSIVE METABOLIC PANEL
ALT: 101 U/L — ABNORMAL HIGH (ref 0–44)
AST: 149 U/L — ABNORMAL HIGH (ref 15–41)
Albumin: 3.1 g/dL — ABNORMAL LOW (ref 3.5–5.0)
Alkaline Phosphatase: 92 U/L (ref 38–126)
Anion gap: 10 (ref 5–15)
BUN: 13 mg/dL (ref 6–20)
CO2: 27 mmol/L (ref 22–32)
Calcium: 9.1 mg/dL (ref 8.9–10.3)
Chloride: 100 mmol/L (ref 98–111)
Creatinine, Ser: 0.82 mg/dL (ref 0.61–1.24)
GFR calc Af Amer: >60 mL/min (ref >60)
GFR calc non Af Amer: >60 mL/min (ref >60)
Glucose, Bld: 93 mg/dL (ref 70–99)
Potassium: 3.6 mmol/L (ref 3.5–5.1)
Sodium: 137 mmol/L (ref 135–145)
Total Bilirubin: 2 mg/dL — ABNORMAL HIGH (ref 0.3–1.2)
Total Protein: 6.5 g/dL (ref 6.5–8.1)

## 2019-12-11 LAB — AMMONIA: Ammonia: 37 umol/L — ABNORMAL HIGH (ref 9–35)

## 2019-12-11 MED ORDER — LORAZEPAM 2 MG/ML IJ SOLN
2.0000 mg | Freq: Once | INTRAMUSCULAR | Status: AC | PRN
  Administered 2019-12-11: 2 mg via INTRAMUSCULAR
  Filled 2019-12-11: qty 1

## 2019-12-11 MED ORDER — LORAZEPAM 2 MG/ML IJ SOLN
1.0000 mg | Freq: Three times a day (TID) | INTRAMUSCULAR | Status: AC | PRN
  Administered 2019-12-11 (×2): 1 mg via INTRAVENOUS
  Filled 2019-12-11 (×2): qty 1

## 2019-12-11 MED ORDER — CEPHALEXIN 500 MG PO CAPS
500.0000 mg | ORAL_CAPSULE | Freq: Two times a day (BID) | ORAL | Status: DC
  Administered 2019-12-14 – 2019-12-20 (×13): 500 mg via ORAL
  Filled 2019-12-11 (×16): qty 1

## 2019-12-11 MED ORDER — OLANZAPINE 5 MG PO TBDP
5.0000 mg | ORAL_TABLET | Freq: Two times a day (BID) | ORAL | Status: DC
  Administered 2019-12-14 – 2019-12-16 (×6): 5 mg via ORAL
  Filled 2019-12-11 (×13): qty 1

## 2019-12-11 MED ORDER — OLANZAPINE 10 MG IM SOLR
5.0000 mg | Freq: Two times a day (BID) | INTRAMUSCULAR | Status: DC
  Administered 2019-12-11 – 2019-12-13 (×4): 5 mg via INTRAMUSCULAR
  Filled 2019-12-11 (×13): qty 10

## 2019-12-11 MED ORDER — METOPROLOL TARTRATE 5 MG/5ML IV SOLN
5.0000 mg | Freq: Four times a day (QID) | INTRAVENOUS | Status: DC | PRN
  Administered 2019-12-11: 5 mg via INTRAVENOUS
  Filled 2019-12-11 (×2): qty 5

## 2019-12-11 MED ORDER — HALOPERIDOL LACTATE 5 MG/ML IJ SOLN
5.0000 mg | Freq: Once | INTRAMUSCULAR | Status: AC | PRN
  Administered 2019-12-11: 5 mg via INTRAMUSCULAR
  Filled 2019-12-11: qty 1

## 2019-12-11 MED ORDER — LORAZEPAM 2 MG/ML IJ SOLN
1.0000 mg | Freq: Once | INTRAMUSCULAR | Status: AC
  Administered 2019-12-11: 1 mg via INTRAVENOUS
  Filled 2019-12-11: qty 1

## 2019-12-11 MED ORDER — DIPHENHYDRAMINE HCL 50 MG/ML IJ SOLN
50.0000 mg | Freq: Once | INTRAMUSCULAR | Status: AC | PRN
  Administered 2019-12-11: 50 mg via INTRAMUSCULAR
  Filled 2019-12-11: qty 1

## 2019-12-11 NOTE — Progress Notes (Signed)
Spoke to psch NP, verbalized that pscy team already given rec'd today, but advise RN to admin ativan and call the MD. Will paged pscy MD again.  Will continue to monitor.
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
## 2019-12-11 NOTE — Progress Notes (Signed)
Patient spitted out all oral medications. Alternative IV medicaitons given. Patient continue to appears agitate, having  hallucination, and speaking  randomly. Sitter at bedside., Restraints in place due to attempting to getting OOB.  POC: PRN meds admin  Emotional support given  Consult MD

## 2019-12-11 NOTE — Progress Notes (Signed)
PT Cancellation Note  Patient Details Name: Paul Bender MRN: [id number] DOB: 1990/11/27   Cancelled Treatment:    Reason Eval/Treat Not Completed: Medical issues which prohibited therapy. Pt is agitated throughout the day despite RN providing multiple medications to sedate. Per RN patient unable to reliably follow commands at this time due to current cognitive state. PT will hold at this time as patients cognitive state places patient at a high risk for breaking WB precautions and injury at this time.   Arlyss Gandy 12/11/2019, 4:35 PM
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
## 2019-12-11 NOTE — Progress Notes (Signed)
When this nurse arrived on the unit patient was actively trying to get out of the bed saying he needed to find his dad because it was his birthday and he was going to die. Pt was able to answer all the orientation questions appropriately but he continued to talk to people who were not in the room and try to get out of the bed. Soft wrist restraints were applied bilaterally and right ankle restraint applied for pt safety. Pt was given ativan and haldol with no effect. Pt continued to try to get out of restraints to go out side. Psych consulted and one time dose of ativan 2mg  haldol and benedryl all IM given. Pt continues to try to get out of bed. Zyprexa im given. Pt still continues to try to get out of bed. Pt keeps reaching for things not present. Mews is 4 this evening d/t agitated stated. Pt will not hold still and keeps struggling to get out of bed. RN

## 2019-12-11 NOTE — Consult Note (Signed)
  Spoke with Paul Lofts, RN to set up [name] for psychiatric consults.  Reports she is in progression meeting currently and will call when she is finished to set up tele psych.

## 2019-12-11 NOTE — Progress Notes (Signed)
OT Cancellation Note  Patient Details Name: Paul Bender MRN: [id number] DOB: 01-Mar-1991   Cancelled Treatment:    Reason Eval/Treat Not Completed: Other (comment)(Pt with increased agitation and meds given. )Will attempt later date.  Thornell Mule, OT/L   Acute OT Clinical Specialist Acute Rehabilitation Services Pager [phone] Office [phone]  12/11/2019, 6:05 PM
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
## 2019-12-12 ENCOUNTER — Inpatient Hospital Stay (HOSPITAL_COMMUNITY): Payer: No Typology Code available for payment source

## 2019-12-12 DIAGNOSIS — R609 Edema, unspecified: Secondary | ICD-10-CM

## 2019-12-12 MED ORDER — BOOST / RESOURCE BREEZE PO LIQD CUSTOM
1.0000 | Freq: Four times a day (QID) | ORAL | Status: DC
  Administered 2019-12-13 – 2019-12-25 (×21): 1 via ORAL

## 2019-12-12 NOTE — Progress Notes (Signed)
Left lower extremity venous duplex has been completed. Preliminary results can be found in CV Proc through chart review.   12/12/19 12:31 PM Olen Cordial RVT

## 2019-12-12 NOTE — Progress Notes (Signed)
OT / PT Cancellation Note  Patient Details Name: Paul Bender MRN: [id number] DOB: 05-17-1991   Cancelled Treatment:    Reason Eval/Treat Not Completed: Patient not medically ready RN staff requesting that patient be allowed to sleep on first attempt as patient has been awake for 3 days. On second attempt pt was sleeping and showed agitation spitting toward sitter in room. Pt sleeping soundly and talking in his sleep. OT /PT to attempt again when pt is more appropriate with less sedating medications.  Wynona Neat, OTR/L  Acute Rehabilitation Services Pager: [phone] Office: [phone] .  12/12/2019, 1:34 PM
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
## 2019-12-12 NOTE — Progress Notes (Signed)
Patient noted agitated and hallucinating throughout shift and  appears more resting around 0530AM. VSS appears better with resting state. HR in the 110s and RR in the low 20s.   POC: Continue with regimen and closely monitoring.  Emotional and reassurance provided.  1:1 sitter to promote safety  Pos. removal of restraints if LOC improve

## 2019-12-12 NOTE — TOC Progression Note (Signed)
Transition of Care [facility]) - Progression Note    Patient Details  Name: Paul Bender MRN: [id number] Date of Birth: Nov 18, 1990  [facility] CM/SW Contact  Astrid Drafts Berna Spare, RN Phone Number: 12/12/2019, 5:02 PM  Clinical Narrative:  Referral has been faxed to [facility] for psychiatric admission.  Will provide updates regarding potential admission as they are available.      Expected Discharge Plan: Psychiatric Hospital Barriers to Discharge: Continued Medical Work up  Expected Discharge Plan and Services Expected Discharge Plan: Psychiatric Hospital   Discharge Planning Services: CM Consult   Living arrangements for the past 2 months: Single Family Home                                       Social Determinants of Health (SDOH) Interventions    Readmission Risk Interventions No flowsheet data found.  Quintella Baton, RN, BSN  Trauma/Neuro ICU Case Manager [phone]
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
## 2019-12-13 DIAGNOSIS — S3730XA Unspecified injury of urethra, initial encounter: Secondary | ICD-10-CM

## 2019-12-13 DIAGNOSIS — S0292XA Unspecified fracture of facial bones, initial encounter for closed fracture: Secondary | ICD-10-CM

## 2019-12-13 LAB — BASIC METABOLIC PANEL
Anion gap: 11 (ref 5–15)
BUN: 22 mg/dL — ABNORMAL HIGH (ref 6–20)
CO2: 26 mmol/L (ref 22–32)
Calcium: 9.3 mg/dL (ref 8.9–10.3)
Chloride: 101 mmol/L (ref 98–111)
Creatinine, Ser: 0.89 mg/dL (ref 0.61–1.24)
GFR calc Af Amer: >60 mL/min (ref >60)
GFR calc non Af Amer: >60 mL/min (ref >60)
Glucose, Bld: 105 mg/dL — ABNORMAL HIGH (ref 70–99)
Potassium: 4.2 mmol/L (ref 3.5–5.1)
Sodium: 138 mmol/L (ref 135–145)

## 2019-12-13 LAB — CBC
HCT: 34.4 % — ABNORMAL LOW (ref 39.0–52.0)
Hemoglobin: 11.1 g/dL — ABNORMAL LOW (ref 13.0–17.0)
MCH: 29.9 pg (ref 26.0–34.0)
MCHC: 32.3 g/dL (ref 30.0–36.0)
MCV: 92.7 fL (ref 80.0–100.0)
Platelets: 526 10*3/uL — ABNORMAL HIGH (ref 150–400)
RBC: 3.71 MIL/uL — ABNORMAL LOW (ref 4.22–5.81)
RDW: 15.7 % — ABNORMAL HIGH (ref 11.5–15.5)
WBC: 13.2 10*3/uL — ABNORMAL HIGH (ref 4.0–10.5)
nRBC: 0 % (ref 0.0–0.2)

## 2019-12-13 NOTE — Progress Notes (Signed)
I visited with [place]'s parents when I arrived. Pt was lying still with his eyes closed; mom said he was resting and I did not try to arouse him.  Pt's mom asked for prayer which we had bedside of pt.  They were very appreciative of visit and prayer.  Pt's dad spoke w/me outside of pt's room and would like a spiritual follow-up visit w/pt when he is more coherent.  I told him how I would take steps to follow-up w/that.  Please page if additional support is needed prior to that visit. Chaplain Elmarie Shiley, MDiv   12/13/19 1400  Clinical Encounter Type  Visited With Patient and family together

## 2019-12-13 NOTE — Plan of Care (Signed)
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
## 2019-12-14 MED ORDER — LORAZEPAM 2 MG/ML IJ SOLN
1.0000 mg | Freq: Once | INTRAMUSCULAR | Status: AC
  Administered 2019-12-14: 1 mg via INTRAVENOUS

## 2019-12-14 MED ORDER — LORAZEPAM 2 MG/ML IJ SOLN
INTRAMUSCULAR | Status: AC
  Administered 2019-12-14: 1 mg
  Filled 2019-12-14: qty 1
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
## 2019-12-14 NOTE — Progress Notes (Signed)
Patient has calm, cooperative, with blunted affect. Taking Crushed PO/Liquid meds by mouth without issues thus far. C/O headache this am, and PRN medication given. Staples removed from frontal laceration site. Removed easily, no bleeding, patient informed before and tolerated well. Cleansed and ointment applied. Mother visiting at this time. Father was in earlier. SI sitter present at all times. Gabriel Cirri RN

## 2019-12-14 NOTE — Progress Notes (Signed)
Pt was becoming agitated. Pt stated " I want you to kill me." "I want to be placed in restraints". Notified MD Dwain Sarna. Was given a verbal order for 1 mg of Ativan via IV.  Denver Faster, RN
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
## 2019-12-15 NOTE — Progress Notes (Signed)
Pt mother request that the provider call and give her an update at their earliest convenience. Mayford Knife RN
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
## 2019-12-16 ENCOUNTER — Inpatient Hospital Stay (HOSPITAL_COMMUNITY): Payer: No Typology Code available for payment source

## 2019-12-16 MED ORDER — POLYETHYLENE GLYCOL 3350 17 G PO PACK
17.0000 g | PACK | Freq: Two times a day (BID) | ORAL | Status: DC
  Administered 2019-12-16 – 2020-01-09 (×35): 17 g via ORAL
  Filled 2019-12-16 (×42): qty 1

## 2019-12-16 MED ORDER — IOHEXOL 300 MG/ML  SOLN
50.0000 mL | Freq: Once | INTRAMUSCULAR | Status: AC | PRN
  Administered 2019-12-16: 50 mL

## 2019-12-16 NOTE — Progress Notes (Signed)
0230 - Pt removed IV. Pt refused new IV placement.
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
## 2019-12-17 MED ORDER — OLANZAPINE 5 MG PO TBDP
5.0000 mg | ORAL_TABLET | Freq: Every day | ORAL | Status: DC
  Administered 2019-12-17 – 2019-12-18 (×2): 5 mg via ORAL
  Filled 2019-12-17 (×3): qty 1

## 2019-12-17 MED ORDER — OLANZAPINE 10 MG IM SOLR
5.0000 mg | Freq: Every day | INTRAMUSCULAR | Status: DC
  Filled 2019-12-17 (×3): qty 10

## 2019-12-17 MED ORDER — BISACODYL 10 MG RE SUPP: 10.0000 mg | Freq: Once | RECTAL | Status: DC

## 2019-12-17 MED ORDER — OLANZAPINE 5 MG PO TABS
7.5000 mg | ORAL_TABLET | Freq: Every day | ORAL | Status: DC
  Administered 2019-12-17 – 2019-12-18 (×2): 7.5 mg via ORAL
  Filled 2019-12-17 (×3): qty 2

## 2019-12-17 MED ORDER — HYDROCHLOROTHIAZIDE 25 MG PO TABS
25.0000 mg | ORAL_TABLET | Freq: Every day | ORAL | Status: DC
  Administered 2019-12-17 – 2019-12-18 (×2): 25 mg via ORAL
  Filled 2019-12-17 (×3): qty 1
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
## 2019-12-17 NOTE — TOC CAGE-AID Note (Signed)
Transition of Care [facility]) - CAGE-AID Screening   Patient Details  Name: SLAYDE BRAULT MRN: [id number] Date of Birth: Mar 09, 1991  [facility]) CM/SW Contact:    Jimmy Picket, [place] Phone Number: 12/17/2019, 2:02 PM   Clinical Narrative: Pt was present with his father. Pt stated that he drinks alcohol and smokes marijuana occasionally. Pt stated he is a follower and he does with his friends do. Pt also stated that he has a family history of alcohol use and doesnt want to fall into that.   Pt asked CSW "Why did I have to be born" CSW responded by encouraging pt to receive mental health support to help answer those questions. Pt stated that he doesn't feel like hes getting mental health support here. CSW informed pt that once he is medically stable he will go to another facility that will provide him with the extra support he needs. CSW offered to print worksheets offline for pt to occupy time and pt declined.   CAGE-AID Screening:    Have You Ever Felt You Ought to Cut Down on Your Drinking or Drug Use?: No Have People Annoyed You By Critizing Your Drinking Or Drug Use?: No Have You Felt Bad Or Guilty About Your Drinking Or Drug Use?: No Have You Ever Had a Drink or Used Drugs First Thing In The Morning to STeady Your Nerves or to Get Rid of a Hangover?: No CAGE-AID Score: 0  Substance Abuse Education Offered: No    Denita Lung, Bridget Hartshorn Clinical Social Worker [phone]
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
## 2019-12-17 NOTE — Progress Notes (Signed)
1200: Pt had been calm and cooperative. Allowing sitter to help bath and change bed. Now patient is agitated and uncooperative, not wanting to take meds, telling [place] he wishes he was never born. Talking on the phone to his mother he says he will go wherever to get the mental health he needs. Father present at this timeframe. Gabriel Cirri RN

## 2019-12-18 ENCOUNTER — Inpatient Hospital Stay (HOSPITAL_COMMUNITY): Payer: No Typology Code available for payment source

## 2019-12-18 MED ORDER — HALOPERIDOL LACTATE 5 MG/ML IJ SOLN: 5.0000 mg | Freq: Once | INTRAMUSCULAR | Status: DC | PRN
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
## 2019-12-18 NOTE — TOC Progression Note (Signed)
Transition of Care [facility]) - Progression Note    Patient Details  Name: Paul Bender MRN: [id number] Date of Birth: 1991-07-16  Transition of Care [facility]) CM/SW Contact  Astrid Drafts Berna Spare, RN Phone Number: 12/18/2019, 4:27 PM  Clinical Narrative:  Pt denied yesterday by [facility] due to weightbearing status.  Ortho getting films today; may allow for WB for transfers on LLE.  Will follow progress.   Expected Discharge Plan: Psychiatric Hospital Barriers to Discharge: Continued Medical Work up  Expected Discharge Plan and Services Expected Discharge Plan: Psychiatric Hospital   Discharge Planning Services: CM Consult   Living arrangements for the past 2 months: Single Family Home                                       Social Determinants of Health (SDOH) Interventions    Readmission Risk Interventions No flowsheet data found.  Quintella Baton, RN, BSN  Trauma/Neuro ICU Case Manager [phone]
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
## 2019-12-18 NOTE — Progress Notes (Signed)
PT Cancellation Note  Patient Details Name: Paul Bender MRN: [id number] DOB: [birth year]   Cancelled Treatment:    Reason Eval/Treat Not Completed: Patient declined, no reason specified. PT attempted to see patient for treatment however patient declining at this time, reporting he does not want physical therapy services today. Pt does become agreeable to PT returning later in the day with encouragement. Pt noted to be pulling at wires in mouth from mandibular fixation, PT educates pt on the need to stop pulling at wires, however the patient continues. RN made aware.   Arlyss Gandy 12/18/2019, 9:02 AM

## 2019-12-18 NOTE — Progress Notes (Signed)
Ortho Trauma Note  Reviewed films of left tibia and fixation is stable. I will allow for WB for transfers only. No walker ambulation.  Roby Lofts, MD [facility] [phone] (office) [email or web address]
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
## 2019-12-19 MED ORDER — BENZTROPINE MESYLATE 0.5 MG PO TABS
0.5000 mg | ORAL_TABLET | Freq: Two times a day (BID) | ORAL | Status: DC
  Administered 2019-12-19 – 2020-01-09 (×40): 0.5 mg via ORAL
  Filled 2019-12-19 (×45): qty 1

## 2019-12-19 MED ORDER — HALOPERIDOL 5 MG PO TABS
5.0000 mg | ORAL_TABLET | Freq: Two times a day (BID) | ORAL | Status: DC
  Administered 2019-12-19 – 2019-12-23 (×8): 5 mg via ORAL
  Filled 2019-12-19 (×7): qty 1
  Filled 2019-12-19: qty 5
  Filled 2019-12-19 (×4): qty 1

## 2019-12-19 MED ORDER — HALOPERIDOL 5 MG PO TABS
10.0000 mg | ORAL_TABLET | Freq: Every day | ORAL | Status: DC
  Administered 2019-12-20 – 2019-12-22 (×3): 10 mg via ORAL
  Filled 2019-12-19 (×3): qty 2
  Filled 2019-12-19: qty 10
  Filled 2019-12-19 (×3): qty 2

## 2019-12-19 MED ORDER — HYDROCHLOROTHIAZIDE 50 MG PO TABS
50.0000 mg | ORAL_TABLET | Freq: Every day | ORAL | Status: DC
  Administered 2019-12-19 – 2020-01-09 (×21): 50 mg via ORAL
  Filled 2019-12-19: qty 1
  Filled 2019-12-19: qty 2
  Filled 2019-12-19: qty 1
  Filled 2019-12-19: qty 2
  Filled 2019-12-19: qty 1
  Filled 2019-12-19 (×5): qty 2
  Filled 2019-12-19 (×3): qty 1
  Filled 2019-12-19 (×4): qty 2
  Filled 2019-12-19: qty 1
  Filled 2019-12-19: qty 2
  Filled 2019-12-19 (×8): qty 1
  Filled 2019-12-19 (×2): qty 2
  Filled 2019-12-19 (×2): qty 1
  Filled 2019-12-19 (×3): qty 2
  Filled 2019-12-19: qty 1
  Filled 2019-12-19: qty 2
  Filled 2019-12-19 (×4): qty 1

## 2019-12-19 MED ORDER — HALOPERIDOL LACTATE 5 MG/ML IJ SOLN: 5.0000 mg | Freq: Two times a day (BID) | INTRAMUSCULAR | Status: DC

## 2019-12-19 MED ORDER — HALOPERIDOL LACTATE 5 MG/ML IJ SOLN: 10.0000 mg | Freq: Every day | INTRAMUSCULAR | Status: DC
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
## 2019-12-19 NOTE — Progress Notes (Signed)
2300: Pt refusing to take medications, talk to RN, and VS being taken. Pt says he doesn't trust staff here and is refusing care.

## 2019-12-19 NOTE — Progress Notes (Signed)
   ENT Progress Note: POD #14 s/p Procedure(s): Mandibulomaxillary fixation   Subjective: Patient stable after mandibulomaxillary fixation with complaints of wire poking his lip.  He is tolerating liquid diet.  Objective: Vital signs in last 24 hours: Temp:  [97 F (36.1 C)-97.8 F (36.6 C)] 97.6 F (36.4 C) (05/28 1601) Pulse Rate:  [83-99] 95 (05/28 1601) Resp:  [19-20] 19 (05/28 1601) BP: (129-161)/(83-107) 132/95 (05/28 1601) SpO2:  [98 %-100 %] 100 % (05/28 1601) Weight change:  Last BM Date: 12/09/19  Intake/Output from previous day: 05/27 0701 - 05/28 0700 In: -  Out: 850 [Urine:850] Intake/Output this shift: Total I/O In: 960 [P.O.:960] Out: 900 [Urine:900]  Labs: No results for input(s): WBC, HGB, HCT, PLT in the last 72 hours. No results for input(s): NA, K, CL, CO2, GLUCOSE, BUN, CALCIUM in the last 72 hours.  Invalid input(s): CREATININR  Studies/Results: DG Tibia/Fibula Left Port  Result Date: 12/18/2019 CLINICAL DATA:  Left tib fib fracture status post trauma EXAM: PORTABLE LEFT TIBIA AND FIBULA - 2 VIEW COMPARISON:  12/03/2019 FINDINGS: Tibial IM nail and screw device appears stable in position. The fracture fragments remain in near anatomic alignment. The fracture lines remain distinct. Comminuted fracture of the mid to distal shaft of the fibula is stable from previous exam. IMPRESSION: Unchanged appearance of the left lower leg status post ORIF of comminuted tibial and fibular fractures. Fracture fragments remain in near anatomic alignment. Electronically Signed   By: Signa Kell M.D.   On: 12/18/2019 09:59     PHYSICAL EXAM: Patient in good occlusion.  His four-point mandibulomaxillary wire fixation is stable on the left but the wire has broken on the right.   Assessment/Plan: The patient has a nondisplaced right mandible angle fracture and will require approximately 6 to 8 weeks of mandibulomaxillary fixation for effective healing.  History and  findings were discussed with the patient and his father at the bedside.  Recommend replacing the right mandibulomaxillary fixation wire under sedated anesthesia.  Unfortunately, patient has already eaten and we are unable to perform his surgery this evening.  We will schedule wire replacement with my partner Dr. Jenne Pane for 7:30 on 5/29 in the [facility] Main OR.  Risks and benefits of the procedure discussed with the patient and his father and informed consent was obtained.    Osborn Coho 12/19/2019, 6:28 PM
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
## 2019-12-20 ENCOUNTER — Inpatient Hospital Stay (HOSPITAL_COMMUNITY): Payer: No Typology Code available for payment source | Admitting: Certified Registered Nurse Anesthetist

## 2019-12-20 ENCOUNTER — Encounter (HOSPITAL_COMMUNITY)
Admission: EM | Disposition: A | Discharge status: Other Acute Inpatient Hospital | Payer: Self-pay | Source: Home / Self Care

## 2019-12-20 HISTORY — PX: CLOSED REDUCTION MANDIBLE: SHX5307

## 2019-12-20 SURGERY — CLOSED REDUCTION, MANDIBLE
Anesthesia: Monitor Anesthesia Care | Site: Mouth

## 2019-12-20 MED ORDER — ONDANSETRON HCL 4 MG/2ML IJ SOLN
INTRAMUSCULAR | Status: DC | PRN
  Administered 2019-12-20: 4 mg via INTRAVENOUS

## 2019-12-20 MED ORDER — LIDOCAINE 2% (20 MG/ML) 5 ML SYRINGE
INTRAMUSCULAR | Status: AC
  Filled 2019-12-20: qty 5

## 2019-12-20 MED ORDER — LIDOCAINE-EPINEPHRINE 1 %-1:100000 IJ SOLN
INTRAMUSCULAR | Status: DC | PRN
  Administered 2019-12-20: 2 mL

## 2019-12-20 MED ORDER — PROPOFOL 1000 MG/100ML IV EMUL
INTRAVENOUS | Status: AC
  Filled 2019-12-20: qty 100

## 2019-12-20 MED ORDER — PROPOFOL 10 MG/ML IV BOLUS
INTRAVENOUS | Status: AC
  Filled 2019-12-20: qty 20

## 2019-12-20 MED ORDER — GLYCOPYRROLATE 0.2 MG/ML IJ SOLN
INTRAMUSCULAR | Status: DC | PRN
  Administered 2019-12-20: .2 mg via INTRAVENOUS

## 2019-12-20 MED ORDER — FENTANYL CITRATE (PF) 250 MCG/5ML IJ SOLN
INTRAMUSCULAR | Status: AC
  Filled 2019-12-20: qty 5

## 2019-12-20 MED ORDER — LACTATED RINGERS IV SOLN: INTRAVENOUS | Status: DC | PRN

## 2019-12-20 MED ORDER — FENTANYL CITRATE (PF) 100 MCG/2ML IJ SOLN: 25.0000 ug | INTRAMUSCULAR | Status: DC | PRN

## 2019-12-20 MED ORDER — OXYCODONE HCL 5 MG/5ML PO SOLN: 5.0000 mg | Freq: Once | ORAL | Status: DC | PRN

## 2019-12-20 MED ORDER — MIDAZOLAM HCL 5 MG/5ML IJ SOLN
INTRAMUSCULAR | Status: DC | PRN
  Administered 2019-12-20: .5 mg via INTRAVENOUS
  Administered 2019-12-20: 1 mg via INTRAVENOUS

## 2019-12-20 MED ORDER — DEXAMETHASONE SODIUM PHOSPHATE 10 MG/ML IJ SOLN
INTRAMUSCULAR | Status: DC | PRN
Start: 2019-12-20 — End: 2019-12-20
  Administered 2019-12-20: 10 mg via INTRAVENOUS

## 2019-12-20 MED ORDER — ONDANSETRON HCL 4 MG/2ML IJ SOLN: 4.0000 mg | Freq: Once | INTRAMUSCULAR | Status: DC | PRN

## 2019-12-20 MED ORDER — DEXAMETHASONE SODIUM PHOSPHATE 10 MG/ML IJ SOLN
INTRAMUSCULAR | Status: AC
  Filled 2019-12-20: qty 1

## 2019-12-20 MED ORDER — FENTANYL CITRATE (PF) 100 MCG/2ML IJ SOLN
INTRAMUSCULAR | Status: DC | PRN
  Administered 2019-12-20: 50 ug via INTRAVENOUS

## 2019-12-20 MED ORDER — ONDANSETRON HCL 4 MG/2ML IJ SOLN
INTRAMUSCULAR | Status: AC
  Filled 2019-12-20: qty 2

## 2019-12-20 MED ORDER — GLYCOPYRROLATE PF 0.2 MG/ML IJ SOSY
PREFILLED_SYRINGE | INTRAMUSCULAR | Status: AC
  Filled 2019-12-20: qty 1

## 2019-12-20 MED ORDER — PROPOFOL 10 MG/ML IV BOLUS
INTRAVENOUS | Status: DC | PRN
  Administered 2019-12-20: 10 mg via INTRAVENOUS
  Administered 2019-12-20: 20 mg via INTRAVENOUS
  Administered 2019-12-20: 10 mg via INTRAVENOUS
  Administered 2019-12-20: 40 mg via INTRAVENOUS
  Administered 2019-12-20: 10 mg via INTRAVENOUS

## 2019-12-20 MED ORDER — LACTATED RINGERS IV SOLN: INTRAVENOUS | Status: DC

## 2019-12-20 MED ORDER — MIDAZOLAM HCL 2 MG/2ML IJ SOLN
INTRAMUSCULAR | Status: AC
  Filled 2019-12-20: qty 2

## 2019-12-20 MED ORDER — OXYCODONE HCL 5 MG PO TABS: 5.0000 mg | ORAL_TABLET | Freq: Once | ORAL | Status: DC | PRN

## 2019-12-20 MED ORDER — LIDOCAINE-EPINEPHRINE 1 %-1:100000 IJ SOLN
INTRAMUSCULAR | Status: AC
  Filled 2019-12-20: qty 1

## 2019-12-20 SURGICAL SUPPLY — 21 items
BLADE SURG 15 STRL LF DISP TIS (BLADE) ×1 IMPLANT
BLADE SURG 15 STRL SS (BLADE) ×1
CANISTER SUCT 3000ML PPV (MISCELLANEOUS) ×2 IMPLANT
COVER SURGICAL LIGHT HANDLE (MISCELLANEOUS) ×2 IMPLANT
DRAPE HALF SHEET 40X57 (DRAPES) ×2 IMPLANT
GAUZE 4X4 16PLY RFD (DISPOSABLE) IMPLANT
GLOVE BIO SURGEON STRL SZ7.5 (GLOVE) ×4 IMPLANT
GOWN STRL REUS W/ TWL LRG LVL3 (GOWN DISPOSABLE) ×2 IMPLANT
GOWN STRL REUS W/TWL LRG LVL3 (GOWN DISPOSABLE) ×2
KIT BASIN OR (CUSTOM PROCEDURE TRAY) ×2 IMPLANT
KIT TURNOVER KIT B (KITS) ×2 IMPLANT
NEEDLE HYPO 25GX1X1/2 BEV (NEEDLE) ×2 IMPLANT
NS IRRIG 1000ML POUR BTL (IV SOLUTION) ×2 IMPLANT
PAD ARMBOARD 7.5X6 YLW CONV (MISCELLANEOUS) ×4 IMPLANT
POSITIONER HEAD DONUT 9IN (MISCELLANEOUS) IMPLANT
SUCTION FRAZIER HANDLE 10FR (MISCELLANEOUS) ×1
SUCTION TUBE FRAZIER 10FR DISP (MISCELLANEOUS) ×1 IMPLANT
SYR CONTROL 10ML LL (SYRINGE) ×2 IMPLANT
TRAY ENT MC OR (CUSTOM PROCEDURE TRAY) ×2 IMPLANT
TUBE CONNECTING 12X1/4 (SUCTIONS) ×2 IMPLANT
YANKAUER SUCT BULB TIP NO VENT (SUCTIONS) ×2 IMPLANT
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
## 2019-12-20 NOTE — Addendum Note (Signed)
Addendum  created 12/20/19 1117 by Chelsea Aus, CRNA   Charge Capture section accepted

## 2019-12-20 NOTE — Brief Op Note (Signed)
12/20/2019  8:10 AM  PATIENT:  Paul Bender  29 y.o. male  PRE-OPERATIVE DIAGNOSIS:  Mandible fracture  POST-OPERATIVE DIAGNOSIS:  Mandible fracture  PROCEDURE:  Procedure(s): Replacement of Mandibular-Maxillary Fixation (N/A)  SURGEON:  Surgeon(s) and Role:    Christia Reading, MD - Primary  PHYSICIAN ASSISTANT:   ASSISTANTS: none   ANESTHESIA:   IV sedation  EBL: Minimal   BLOOD ADMINISTERED:none  DRAINS: none   LOCAL MEDICATIONS USED:  LIDOCAINE   SPECIMEN:  No Specimen  DISPOSITION OF SPECIMEN:  N/A  COUNTS:  YES  TOURNIQUET:  * No tourniquets in log *  DICTATION: .Other Dictation: Dictation Number [id number]  PLAN OF CARE: Return to hospital room  PATIENT DISPOSITION:  PACU - hemodynamically stable.   Delay start of Pharmacological VTE agent (>24hrs) due to surgical blood loss or risk of bleeding: no

## 2019-12-20 NOTE — Plan of Care (Signed)

## 2019-12-20 NOTE — Progress Notes (Signed)
Patient arrives to Short Stay with safety sitter at bedside. NSR on monitor without ectopy. RR 16 and unlabored.

## 2019-12-20 NOTE — Anesthesia Postprocedure Evaluation (Signed)
Anesthesia Post Note  Patient: Paul Bender  Procedure(s) Performed: Replacement of Mandibular-Maxillary Fixation (N/A Mouth)     Patient location during evaluation: PACU Anesthesia Type: MAC Level of consciousness: awake and alert Pain management: pain level controlled Vital Signs Assessment: post-procedure vital signs reviewed and stable Respiratory status: spontaneous breathing, nonlabored ventilation and respiratory function stable Cardiovascular status: stable and blood pressure returned to baseline Anesthetic complications: no    Last Vitals:  Vitals:   12/20/19 0755 12/20/19 0810  BP: (!) 141/102 (!) 151/111  Pulse: (!) 111 92  Resp: 13 13  Temp: (!) 36.4 C   SpO2: 100% 99%    Last Pain:  Vitals:   12/20/19 0810  TempSrc:   PainSc: 0-No pain                 Audry Pili

## 2019-12-20 NOTE — Transfer of Care (Signed)
Immediate Anesthesia Transfer of Care Note  Patient: Paul Bender  Procedure(s) Performed: Replacement of Mandibular-Maxillary Fixation (N/A Mouth)  Patient Location: PACU  Anesthesia Type:MAC  Level of Consciousness: awake, alert , oriented and patient cooperative  Airway & Oxygen Therapy: Patient Spontanous Breathing  Post-op Assessment: Report given to RN and Post -op Vital signs reviewed and stable  Post vital signs: Reviewed and stable  Last Vitals:  Vitals Value Taken Time  BP 141/102 12/20/19 0753  Temp    Pulse 107 12/20/19 0757  Resp 14 12/20/19 0757  SpO2 100 % 12/20/19 0757  Vitals shown include unvalidated device data.  Last Pain:  Vitals:   12/20/19 0451  TempSrc:   PainSc: 0-No pain         Complications: No apparent anesthesia complications
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
## 2019-12-21 NOTE — Progress Notes (Signed)
Central tele called to notify RN that pt's v lead had intermittently been showing ST elevations. Pt has no chest pain, SOB, or other symptoms. On-call trauma PA text-paged to be made aware of this change.  Robina Ade, RN
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
## 2019-12-22 ENCOUNTER — Encounter: Payer: Self-pay | Admitting: *Deleted

## 2019-12-22 NOTE — Plan of Care (Signed)
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
## 2019-12-23 NOTE — Progress Notes (Signed)
CRITICAL VALUE ALERT  Critical Value:  Prolonged QT 476/579  Date & Time Notied:  12/23/19 1815  Provider Notified: Dr. Bedelia Person Orders Received/Actions taken: Continue to monitor.  Mayford Knife RN

## 2019-12-23 NOTE — TOC Progression Note (Signed)
Transition of Care [facility]) - Progression Note    Patient Details  Name: Paul Bender MRN: [id number] Date of Birth: 09-Oct-1990  Transition of Care [facility]) CM/SW Contact  Astrid Drafts Berna Spare, RN Phone Number: 12/23/2019, 5:19 PM  Clinical Narrative:   Refaxed referral to [facility] (per their instructions, they had discarded all previous information).  Hopeful that they may consider patient now with updated weight-bearing precautions.  [facility] and provide [facility] as available.    Expected Discharge Plan: [facility]iatric Hospital Barriers to Discharge: Continued Medical Work up  Expected Discharge Plan and Services Expected Discharge Plan: Psychiatric Hospital   Discharge Planning Services: CM Consult   Living arrangements for the past 2 months: Single Family Home                                       Social Determinants of Health (SDOH) Interventions    Readmission Risk Interventions No flowsheet data found.  Quintella Baton, RN, BSN  Trauma/Neuro ICU Case Manager [phone]
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
## 2019-12-23 NOTE — Progress Notes (Signed)
Pt continuing to ask for food. Stated his wires were removed either late last night or early this am. Pt demonstrated wires remove by sticking tongue. Pt could not name who took wires out of mouth. This nurse could not find any notes indicating removal. Called ENT office to notify provider that pt has removed the wires. Wires were located by NT on shift in pts room. Awaiting return call from ENT. Mayford Knife RN
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
## 2019-12-24 ENCOUNTER — Other Ambulatory Visit: Payer: Self-pay | Admitting: Otolaryngology

## 2019-12-24 ENCOUNTER — Inpatient Hospital Stay (HOSPITAL_COMMUNITY): Payer: No Typology Code available for payment source

## 2019-12-24 MED ORDER — LORAZEPAM 2 MG/ML IJ SOLN
2.0000 mg | Freq: Once | INTRAMUSCULAR | Status: DC
  Filled 2019-12-24: qty 1

## 2019-12-24 MED ORDER — HALOPERIDOL 1 MG PO TABS
5.0000 mg | ORAL_TABLET | Freq: Two times a day (BID) | ORAL | Status: DC
  Administered 2019-12-24 – 2020-01-03 (×20): 5 mg via ORAL
  Filled 2019-12-24 (×20): qty 5

## 2019-12-24 MED ORDER — LORAZEPAM 2 MG/ML IJ SOLN
INTRAMUSCULAR | Status: AC
  Filled 2019-12-24: qty 1

## 2019-12-24 MED ORDER — CLONAZEPAM 0.1 MG/ML ORAL SUSPENSION: 0.5000 mg | Freq: Two times a day (BID) | ORAL | Status: DC

## 2019-12-24 MED ORDER — HALOPERIDOL LACTATE 2 MG/ML PO CONC: 5.0000 mg | Freq: Two times a day (BID) | ORAL | Status: DC

## 2019-12-24 MED ORDER — CLONAZEPAM 0.5 MG PO TBDP
0.5000 mg | ORAL_TABLET | Freq: Two times a day (BID) | ORAL | Status: DC
  Administered 2019-12-24 – 2020-01-03 (×21): 0.5 mg via ORAL
  Filled 2019-12-24 (×21): qty 1

## 2019-12-24 MED ORDER — VALPROIC ACID 250 MG/5ML PO SOLN
250.0000 mg | Freq: Two times a day (BID) | ORAL | Status: DC
  Administered 2019-12-24 – 2020-01-09 (×32): 250 mg via ORAL
  Filled 2019-12-24 (×32): qty 5
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
## 2019-12-24 NOTE — Progress Notes (Signed)
   ENT Progress Note:  s/p Procedure(s): Replacement of Mandibular-Maxillary Fixation   Subjective: No pain or swelling  Objective: Vital signs in last 24 hours: Temp:  [97.8 F (36.6 C)-98.6 F (37 C)] 98.6 F (37 C) (06/02 0449) Pulse Rate:  [88-99] 99 (06/01 2308) Resp:  [18-20] 18 (06/02 0449) BP: (130-152)/(86-93) 152/93 (06/02 0449) SpO2:  [100 %] 100 % (06/02 0449) Weight change:  Last BM Date: 12/21/19  Intake/Output from previous day: 06/01 0701 - 06/02 0700 In: 1060 [P.O.:1060] Out: 1865 [Urine:1865] Intake/Output this shift: No intake/output data recorded.  Labs: No results for input(s): WBC, HGB, HCT, PLT in the last 72 hours. No results for input(s): NA, K, CL, CO2, GLUCOSE, BUN, CALCIUM in the last 72 hours.  Invalid input(s): CREATININR  Studies/Results: No results found.   PHYSICAL EXAM: Patient with good occlusion, no swelling in the right mandibular fracture site.   Assessment/Plan: Patient unable to maintain mandibulomaxillary fixation, he cut his wires on 12/22/2019.  Given the unfavorable nature of his fracture I would recommend ORIF of right mandible fracture.  Patient scheduled for surgery on 12/26/2019 under general anesthesia at [facility] OR.  Patient would be suitable for transfer several days after his surgical procedure.  He would also be able to tolerate a liquid and soft diet after this operation.  The risks and benefits of the procedure were discussed with the patient as well as his father and we will plan for surgery as scheduled on Friday morning, 6/4.    Paul Bender 12/24/2019, 11:54 AM

## 2019-12-24 NOTE — Progress Notes (Signed)
Inpatient Rehab Admissions Coordinator:   Recommendations still for inpatient psych.  Will sign off for CIR at this time.  Should recommendations change, please feel free to reconsult.   Estill Dooms, PT, DPT Admissions Coordinator [phone] 12/24/19  10:52 AM
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
## 2019-12-24 NOTE — Progress Notes (Signed)
Patient threw all liquids from full liquid tray into the room floor screaming him just wanted to eat a sandwich, cereal and chicken, advised he was still on full liquid so staff could not give those items. Patient was laying in bed with back against footboard singing what kind of sandwich he wanted to eat.

## 2019-12-24 NOTE — TOC Progression Note (Addendum)
Transition of Care [facility]) - Progression Note    Patient Details  Name: Paul Bender MRN: [id number] Date of Birth: October 01, 1990  Transition of Care [facility]) CM/SW Contact  Astrid Drafts Berna Spare, RN Phone Number: 12/24/2019, 2:46 PM  Clinical Narrative:  Due to this morning's behavioral issues and attempts to leave hospital, decision made to IVC.    IVC paperwork completed; notified [employer] of need for service of IVC.  Faxed [facility] referral yesterday; called [facility] intake today to complete verbal interview.  Charge nurse to notify this case manager if any additional information needed.       Expected Discharge Plan: Psychiatric Hospital Barriers to Discharge: Continued Medical Work up  Expected Discharge Plan and Services Expected Discharge Plan: [facility]iatric Hospital   Discharge Planning Services: CM Consult   Living arrangements for the past 2 months: Single Family Home                                       Social Determinants of Health (SDOH) Interventions    Readmission Risk Interventions No flowsheet data found.  Quintella Baton, RN, BSN  Trauma/Neuro ICU Case Manager [phone]
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
## 2019-12-24 NOTE — Plan of Care (Signed)

## 2019-12-25 MED ORDER — BOOST / RESOURCE BREEZE PO LIQD CUSTOM
1.0000 | Freq: Three times a day (TID) | ORAL | Status: DC
  Administered 2019-12-27 – 2020-01-09 (×13): 1 via ORAL
  Filled 2019-12-25: qty 1
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
## 2019-12-26 ENCOUNTER — Encounter (HOSPITAL_COMMUNITY)
Admission: EM | Disposition: A | Discharge status: Other Acute Inpatient Hospital | Payer: Self-pay | Source: Home / Self Care

## 2019-12-26 ENCOUNTER — Inpatient Hospital Stay (HOSPITAL_COMMUNITY): Payer: No Typology Code available for payment source | Admitting: Anesthesiology

## 2019-12-26 HISTORY — PX: ORIF MANDIBULAR FRACTURE: SHX2127

## 2019-12-26 LAB — VALPROIC ACID LEVEL: Valproic Acid Lvl: 22 ug/mL — ABNORMAL LOW (ref 50.0–100.0)

## 2019-12-26 SURGERY — OPEN REDUCTION INTERNAL FIXATION (ORIF) MANDIBULAR FRACTURE
Anesthesia: General | Site: Face

## 2019-12-26 MED ORDER — OXYCODONE HCL 5 MG PO TABS: 5.0000 mg | ORAL_TABLET | Freq: Once | ORAL | Status: DC | PRN

## 2019-12-26 MED ORDER — MIDAZOLAM HCL 2 MG/2ML IJ SOLN
INTRAMUSCULAR | Status: AC
  Filled 2019-12-26: qty 2

## 2019-12-26 MED ORDER — DEXMEDETOMIDINE HCL IN NACL 200 MCG/50ML IV SOLN
INTRAVENOUS | Status: DC | PRN
Start: 2019-12-26 — End: 2019-12-26
  Administered 2019-12-26: 8 ug via INTRAVENOUS

## 2019-12-26 MED ORDER — ROCURONIUM BROMIDE 10 MG/ML (PF) SYRINGE
PREFILLED_SYRINGE | INTRAVENOUS | Status: DC | PRN
  Administered 2019-12-26: 20 mg via INTRAVENOUS
  Administered 2019-12-26: 70 mg via INTRAVENOUS

## 2019-12-26 MED ORDER — DIPHENHYDRAMINE HCL 50 MG/ML IJ SOLN
INTRAMUSCULAR | Status: AC
  Filled 2019-12-26: qty 1

## 2019-12-26 MED ORDER — DEXMEDETOMIDINE HCL IN NACL 200 MCG/50ML IV SOLN
INTRAVENOUS | Status: AC
  Filled 2019-12-26: qty 50

## 2019-12-26 MED ORDER — LIDOCAINE-EPINEPHRINE 1 %-1:100000 IJ SOLN
INTRAMUSCULAR | Status: DC | PRN
  Administered 2019-12-26: 7 mL

## 2019-12-26 MED ORDER — DIPHENHYDRAMINE HCL 25 MG PO CAPS
25.0000 mg | ORAL_CAPSULE | Freq: Every evening | ORAL | Status: DC | PRN
  Administered 2019-12-26 – 2020-01-08 (×7): 25 mg via ORAL
  Filled 2019-12-26 (×8): qty 1

## 2019-12-26 MED ORDER — HYDROMORPHONE HCL 1 MG/ML IJ SOLN: 0.2500 mg | INTRAMUSCULAR | Status: DC | PRN

## 2019-12-26 MED ORDER — BACITRACIN ZINC 500 UNIT/GM EX OINT
TOPICAL_OINTMENT | CUTANEOUS | Status: AC
  Filled 2019-12-26: qty 28.35

## 2019-12-26 MED ORDER — LIDOCAINE 2% (20 MG/ML) 5 ML SYRINGE
INTRAMUSCULAR | Status: DC | PRN
  Administered 2019-12-26: 80 mg via INTRAVENOUS

## 2019-12-26 MED ORDER — LACTATED RINGERS IV SOLN: INTRAVENOUS | Status: DC | PRN

## 2019-12-26 MED ORDER — OXYMETAZOLINE HCL 0.05 % NA SOLN
NASAL | Status: AC
  Filled 2019-12-26: qty 30

## 2019-12-26 MED ORDER — CEFAZOLIN SODIUM-DEXTROSE 1-4 GM/50ML-% IV SOLN
1.0000 g | Freq: Three times a day (TID) | INTRAVENOUS | Status: AC
  Administered 2019-12-26 – 2019-12-27 (×3): 1 g via INTRAVENOUS
  Filled 2019-12-26 (×4): qty 50

## 2019-12-26 MED ORDER — MIDAZOLAM HCL 5 MG/5ML IJ SOLN
INTRAMUSCULAR | Status: DC | PRN
  Administered 2019-12-26: 2 mg via INTRAVENOUS

## 2019-12-26 MED ORDER — PROPOFOL 10 MG/ML IV BOLUS
INTRAVENOUS | Status: DC | PRN
  Administered 2019-12-26: 150 mg via INTRAVENOUS

## 2019-12-26 MED ORDER — PROPOFOL 10 MG/ML IV BOLUS
INTRAVENOUS | Status: AC
  Filled 2019-12-26: qty 40

## 2019-12-26 MED ORDER — OXYCODONE HCL 5 MG/5ML PO SOLN: 5.0000 mg | Freq: Once | ORAL | Status: DC | PRN

## 2019-12-26 MED ORDER — CEFAZOLIN SODIUM-DEXTROSE 2-3 GM-%(50ML) IV SOLR
INTRAVENOUS | Status: DC | PRN
  Administered 2019-12-26: 2 g via INTRAVENOUS

## 2019-12-26 MED ORDER — ONDANSETRON HCL 4 MG/2ML IJ SOLN
INTRAMUSCULAR | Status: DC | PRN
  Administered 2019-12-26: 4 mg via INTRAVENOUS

## 2019-12-26 MED ORDER — PROMETHAZINE HCL 25 MG/ML IJ SOLN: 6.2500 mg | INTRAMUSCULAR | Status: DC | PRN

## 2019-12-26 MED ORDER — OXYMETAZOLINE HCL 0.05 % NA SOLN
NASAL | Status: DC | PRN
  Administered 2019-12-26: 2 via NASAL

## 2019-12-26 MED ORDER — FENTANYL CITRATE (PF) 250 MCG/5ML IJ SOLN
INTRAMUSCULAR | Status: AC
  Filled 2019-12-26: qty 5

## 2019-12-26 MED ORDER — DEXAMETHASONE SODIUM PHOSPHATE 10 MG/ML IJ SOLN
INTRAMUSCULAR | Status: DC | PRN
  Administered 2019-12-26: 10 mg via INTRAVENOUS

## 2019-12-26 MED ORDER — LIDOCAINE-EPINEPHRINE 1 %-1:100000 IJ SOLN
INTRAMUSCULAR | Status: AC
  Filled 2019-12-26: qty 1

## 2019-12-26 MED ORDER — MEPERIDINE HCL 25 MG/ML IJ SOLN: 6.2500 mg | INTRAMUSCULAR | Status: DC | PRN

## 2019-12-26 MED ORDER — ONDANSETRON HCL 4 MG/2ML IJ SOLN
INTRAMUSCULAR | Status: AC
  Filled 2019-12-26: qty 2

## 2019-12-26 MED ORDER — SUGAMMADEX SODIUM 200 MG/2ML IV SOLN
INTRAVENOUS | Status: DC | PRN
  Administered 2019-12-26: 150 mg via INTRAVENOUS

## 2019-12-26 MED ORDER — 0.9 % SODIUM CHLORIDE (POUR BTL) OPTIME
TOPICAL | Status: DC | PRN
  Administered 2019-12-26: 1000 mL

## 2019-12-26 MED ORDER — CEFAZOLIN SODIUM-DEXTROSE 2-4 GM/100ML-% IV SOLN
INTRAVENOUS | Status: AC
  Filled 2019-12-26: qty 100

## 2019-12-26 MED ORDER — FENTANYL CITRATE (PF) 250 MCG/5ML IJ SOLN
INTRAMUSCULAR | Status: DC | PRN
  Administered 2019-12-26 (×2): 100 ug via INTRAVENOUS
  Administered 2019-12-26: 50 ug via INTRAVENOUS

## 2019-12-26 MED ORDER — DIPHENHYDRAMINE HCL 50 MG/ML IJ SOLN
INTRAMUSCULAR | Status: DC | PRN
  Administered 2019-12-26: 12.5 mg via INTRAVENOUS

## 2019-12-26 MED ORDER — BACITRACIN ZINC 500 UNIT/GM EX OINT
TOPICAL_OINTMENT | CUTANEOUS | Status: DC | PRN
  Administered 2019-12-26: 1 via TOPICAL

## 2019-12-26 SURGICAL SUPPLY — 46 items
BIT DRILL 1.6X115 (BIT) ×1
BIT DRILL 1.6X115MM (BIT) ×1 IMPLANT
BIT DRILL 1.6X50 (BIT) ×3 IMPLANT
BLADE SURG 15 STRL LF DISP TIS (BLADE) IMPLANT
BLADE SURG 15 STRL SS (BLADE)
CANISTER SUCT 3000ML PPV (MISCELLANEOUS) ×3 IMPLANT
CLEANER TIP ELECTROSURG 2X2 (MISCELLANEOUS) ×3 IMPLANT
DRAPE HALF SHEET 40X57 (DRAPES) ×3 IMPLANT
DRILL BIT 1.6X115MM (BIT) ×2
ELECT COATED BLADE 2.86 ST (ELECTRODE) ×3 IMPLANT
ELECT NEEDLE TIP 2.8 STRL (NEEDLE) IMPLANT
ELECT REM PT RETURN 9FT ADLT (ELECTROSURGICAL) ×3
ELECTRODE REM PT RTRN 9FT ADLT (ELECTROSURGICAL) ×1 IMPLANT
GLOVE BIOGEL M 7.0 STRL (GLOVE) ×3 IMPLANT
GOWN STRL REUS W/ TWL LRG LVL3 (GOWN DISPOSABLE) ×2 IMPLANT
GOWN STRL REUS W/TWL LRG LVL3 (GOWN DISPOSABLE) ×4
KIT BASIN OR (CUSTOM PROCEDURE TRAY) ×3 IMPLANT
KIT TURNOVER KIT B (KITS) ×3 IMPLANT
NEEDLE HYPO 25GX1X1/2 BEV (NEEDLE) IMPLANT
NS IRRIG 1000ML POUR BTL (IV SOLUTION) ×3 IMPLANT
PAD ARMBOARD 7.5X6 YLW CONV (MISCELLANEOUS) ×6 IMPLANT
PATTIES SURGICAL .5 X3 (DISPOSABLE) IMPLANT
PENCIL SMOKE EVACUATOR (MISCELLANEOUS) ×3 IMPLANT
PLATE LOCK 7H CRVD 1.6 (Plate) ×3 IMPLANT
SCISSORS WIRE ANG 4 3/4 DISP (INSTRUMENTS) ×3 IMPLANT
SCREW NON LOCK HT 2.0X16 (Screw) ×9 IMPLANT
SCREW NON LOCK X-DR 2.0X14 (Screw) ×9 IMPLANT
SCREW NON LOCK X-DR 2.0X18 (Screw) ×3 IMPLANT
STAPLER VISISTAT 35W (STAPLE) ×3 IMPLANT
SUT BONE WAX W31G (SUTURE) IMPLANT
SUT CHROMIC 3 0 SH 27 (SUTURE) ×6 IMPLANT
SUT ETHILON 3 0 PS 1 (SUTURE) IMPLANT
SUT ETHILON 5 0 PS 2 18 (SUTURE) ×3 IMPLANT
SUT SILK 3 0 (SUTURE)
SUT SILK 3 0 SH 30 (SUTURE) ×3 IMPLANT
SUT SILK 3-0 18XBRD TIE 12 (SUTURE) IMPLANT
SUT STEEL 0 (SUTURE)
SUT STEEL 0 18XMFL TIE 17 (SUTURE) IMPLANT
SUT STEEL 2 (SUTURE) IMPLANT
SUT VIC AB 3-0 FS2 27 (SUTURE) IMPLANT
SUT VIC AB 4-0 P-3 18X BRD (SUTURE) IMPLANT
SUT VIC AB 4-0 P3 18 (SUTURE)
TOWEL GREEN STERILE FF (TOWEL DISPOSABLE) ×3 IMPLANT
TRAY ENT MC OR (CUSTOM PROCEDURE TRAY) ×3 IMPLANT
WATER STERILE IRR 1000ML POUR (IV SOLUTION) ×3 IMPLANT
YANKAUER SUCT BULB TIP NO VENT (SUCTIONS) ×3 IMPLANT
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
## 2019-12-26 NOTE — Anesthesia Procedure Notes (Signed)
Procedure Name: Intubation Date/Time: 12/26/2019 8:22 AM Performed by: Adria Dill, CRNA Pre-anesthesia Checklist: Patient identified, Emergency Drugs available, Suction available and Patient being monitored Patient Re-evaluated:Patient Re-evaluated prior to induction Oxygen Delivery Method: Circle system utilized Preoxygenation: Pre-oxygenation with 100% oxygen Induction Type: IV induction Ventilation: Mask ventilation without difficulty Laryngoscope Size: Miller and 3 Grade View: Grade I Nasal Tubes: Right, Nasal prep performed, Nasal Rae and Magill forceps- large, utilized Tube size: 7.5 mm Number of attempts: 1 Airway Equipment and Method: Stylet and Oral airway Placement Confirmation: ETT inserted through vocal cords under direct vision,  positive ETCO2 and breath sounds checked- equal and bilateral Tube secured with: Tape Dental Injury: Teeth and Oropharynx as per pre-operative assessment
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
## 2019-12-26 NOTE — Progress Notes (Signed)
   ENT Progress Note: Procedure(s): OPEN REDUCTION INTERNAL FIXATION (ORIF) MANDIBULAR FRACTURE   Subjective: Stable this am  Objective: Vital signs in last 24 hours: Temp:  [98.1 F (36.7 C)-98.6 F (37 C)] 98.3 F (36.8 C) (06/04 0340) Pulse Rate:  [88-99] 99 (06/04 0340) Resp:  [16-20] 20 (06/04 0340) BP: (137-155)/(81-99) 137/95 (06/04 0340) SpO2:  [99 %-100 %] 99 % (06/04 0340) Weight change:  Last BM Date: 12/25/19  Intake/Output from previous day: 06/03 0701 - 06/04 0700 In: 1080 [P.O.:1080] Out: 2275 [Urine:2275] Intake/Output this shift: No intake/output data recorded.  Labs: No results for input(s): WBC, HGB, HCT, PLT in the last 72 hours. No results for input(s): NA, K, CL, CO2, GLUCOSE, BUN, CALCIUM in the last 72 hours.  Invalid input(s): CREATININR  Studies/Results: DG Hand Complete Left  Result Date: 12/24/2019 CLINICAL DATA:  Proximal left thumb pain. Possible injury. The patient is a poor historian. Initial encounter. EXAM: LEFT HAND - COMPLETE 3+ VIEW COMPARISON:  None. FINDINGS: The patient has a fracture through the base of the first metacarpal of the thumb on the ulnar side. The fracture fragment measures 1.2 cm transverse by 1.6 cm at the articular surface of the first [facility] joint by 0.7 cm craniocaudal. The fragment is medially displaced 1 shaft width. There is callus formation about the fracture consistent with subacute injury. IMPRESSION: Subacute displaced fracture through the base of the first metacarpal involves the articular surface and is described above. Electronically Signed   By: Drusilla Kanner M.D.   On: 12/24/2019 13:34     PHYSICAL EXAM: No swelling MMF screws are in place   Assessment/Plan: Pt for ORIF mandible Frx    Osborn Coho 12/26/2019, 8:06 AM

## 2019-12-26 NOTE — Transfer of Care (Signed)
Immediate Anesthesia Transfer of Care Note  Patient: Paul Bender  Procedure(s) Performed: OPEN REDUCTION INTERNAL FIXATION (ORIF) MANDIBULAR FRACTURE (N/A Face)  Patient Location: PACU  Anesthesia Type:General  Level of Consciousness: drowsy and patient cooperative  Airway & Oxygen Therapy: Patient Spontanous Breathing and Patient connected to face mask oxygen  Post-op Assessment: Report given to RN and Post -op Vital signs reviewed and stable  Post vital signs: Reviewed and stable  Last Vitals:  Vitals Value Taken Time  BP 147/98 12/26/19 1011  Temp    Pulse 99 12/26/19 1014  Resp 18 12/26/19 1014  SpO2 100 % 12/26/19 1014  Vitals shown include unvalidated device data.  Last Pain:  Vitals:   12/26/19 0340  TempSrc: Oral  PainSc: 0-No pain         Complications: No apparent anesthesia complications
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
## 2019-12-26 NOTE — Anesthesia Postprocedure Evaluation (Signed)
Anesthesia Post Note  Patient: Paul Bender  Procedure(s) Performed: OPEN REDUCTION INTERNAL FIXATION (ORIF) MANDIBULAR FRACTURE (N/A Face)     Patient location during evaluation: PACU Anesthesia Type: General Level of consciousness: awake and alert Pain management: pain level controlled Vital Signs Assessment: post-procedure vital signs reviewed and stable Respiratory status: spontaneous breathing, nonlabored ventilation and respiratory function stable Cardiovascular status: blood pressure returned to baseline and stable Postop Assessment: no apparent nausea or vomiting Anesthetic complications: no    Last Vitals:  Vitals:   12/26/19 1054 12/26/19 1056  BP: (!) 157/107 (!) 160/109  Pulse: 98 98  Resp:    Temp: 37.1 C   SpO2: 100% 96%    Last Pain:  Vitals:   12/26/19 1054  TempSrc: Oral  PainSc:                  Lowella Curb
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
## 2019-12-26 NOTE — TOC Progression Note (Signed)
Transition of Care [facility]) - Progression Note    Patient Details  Name: Paul Bender MRN: [id number] Date of Birth: 07/20/1991  [facility] CM/SW Contact  Astrid Drafts Berna Spare, RN Phone Number: 12/26/2019, 2:28 PM  Clinical Narrative:   Pt has been denied again by [facility], stating that "patient exceeds rehab capabilities for facility."  Admissions representative states that pt may be considered when physical and occupational therapy needs are lessened, and we may then resubmit referral.  Will follow rehab needs; continue to renew IVC status every 7 days per protocol.      Expected Discharge Plan: [facility] Barriers to Discharge: Continued Medical Work up  Expected Discharge Plan and Services Expected Discharge Plan: Psychiatric Hospital   Discharge Planning Services: CM Consult   Living arrangements for the past 2 months: Single Family Home                                       Social Determinants of Health (SDOH) Interventions    Readmission Risk Interventions No flowsheet data found.  Quintella Baton, RN, BSN  Trauma/Neuro ICU Case Manager [phone]
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
## 2019-12-27 NOTE — Progress Notes (Signed)
   ENT Progress Note: POD #1 s/p Procedure(s): OPEN REDUCTION INTERNAL FIXATION (ORIF) MANDIBULAR FRACTURE   Subjective: Min pain  Objective: Vital signs in last 24 hours: Temp:  [98 F (36.7 C)-99.1 F (37.3 C)] 99.1 F (37.3 C) (06/05 0539) Pulse Rate:  [91-112] 91 (06/05 0539) Resp:  [15-19] 18 (06/05 0539) BP: (129-160)/(82-109) 138/93 (06/05 0539) SpO2:  [96 %-100 %] 100 % (06/05 0539) Weight change:  Last BM Date: 12/25/19  Intake/Output from previous day: 06/04 0701 - 06/05 0700 In: 1590 [P.O.:240; I.V.:1200; IV Piggyback:150] Out: 2120 [Urine:2100; Blood:20] Intake/Output this shift: No intake/output data recorded.  Labs: No results for input(s): WBC, HGB, HCT, PLT in the last 72 hours. No results for input(s): NA, K, CL, CO2, GLUCOSE, BUN, CALCIUM in the last 72 hours.  Invalid input(s): CREATININR  Studies/Results: No results found.   PHYSICAL EXAM: Inc intact Mild swelling right jaw   Assessment/Plan: Stable postop Cont oral care and liquid/soft diet only for 6wks Stable for transfer -  Plan f/u in 6 - 8 weeks.    Osborn Coho 12/27/2019, 8:43 AM
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
## 2019-12-28 NOTE — Plan of Care (Signed)

## 2019-12-28 NOTE — Progress Notes (Signed)
Patient agitated about not being allowed to shut the bathroom door completely to have a bowel movement and take a shower. This sitter explained to him that I had to be able to see him at all times per suicide precaution policy. Patient stated " I have been closing the door and have had privacy all week and now you're telling me I can't shut the door? This is bullshit! I will just wait until you are gone when the other sitter will let me close the door and if they don't I will shut it regardless." Then patient got on the phone and told his family member "This sitter wants to watch me take a shit!"

## 2019-12-29 ENCOUNTER — Encounter: Payer: Self-pay | Admitting: *Deleted
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
## 2019-12-29 NOTE — Progress Notes (Signed)
Patient presently sleeping. Will continue monitoring
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
## 2019-12-29 NOTE — Progress Notes (Signed)
PT Cancellation Note  Patient Details Name: Paul Bender MRN: [id number] DOB: August 10, 1990   Cancelled Treatment:    Reason Eval/Treat Not Completed: (Refused). Pt reports "Ive already done my exercises today." Pt showed PT the blue band and that he completed UE exercises. PT offered to complete LE there ex with resistive bands and pt stated "I'm not in a good mood today so I'm going to pass on the PT. I already did my exercises today." Pt educated on benefits of mobility, pt still declined. Acute PT freq dec to 2x/wk as pt with limited mobility due to inability to advance ambulation due to bilat LE NWB (L WBAT for transfers only). PT to return as able to progress resistive exercises.  Lewis Shock, PT, DPT Acute Rehabilitation Services Pager #: [phone] Office #: [phone]    Iona Hansen 12/29/2019, 11:58 AM
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
## 2019-12-30 NOTE — Progress Notes (Signed)
OT Cancellation Note  Patient Details Name: Paul Bender MRN: [id number] DOB: 1991/04/20   Cancelled Treatment:    Reason Eval/Treat Not Completed: Other (comment) Per RN, pt out walking in hallway earlier today despite pt being NWB RLE and WBAT for transfers only on LLE. RN report pt became agitated and upset and pt just now calming down. Will hold OT session for today to decrease agitation, and plan to check back as time allows.   Audery Amel., COTA/L Acute Rehabilitation Services [phone]   Angelina Pih 12/30/2019, 3:01 PM

## 2019-12-31 ENCOUNTER — Encounter (HOSPITAL_COMMUNITY)
Admission: EM | Disposition: A | Discharge status: Other Acute Inpatient Hospital | Payer: Self-pay | Source: Home / Self Care

## 2019-12-31 SURGERY — OPEN REDUCTION INTERNAL FIXATION (ORIF) METACARPAL
Anesthesia: Choice | Site: Finger | Laterality: Left

## 2019-12-31 MED ORDER — HALOPERIDOL LACTATE 5 MG/ML IJ SOLN
10.0000 mg | Freq: Once | INTRAMUSCULAR | Status: AC
  Administered 2019-12-31: 10 mg via INTRAMUSCULAR
  Filled 2019-12-31: qty 2

## 2019-12-31 MED ORDER — POVIDONE-IODINE 10 % EX SWAB: 2.0000 "application " | Freq: Once | CUTANEOUS | Status: DC

## 2019-12-31 MED ORDER — ENSURE PRE-SURGERY PO LIQD
296.0000 mL | Freq: Once | ORAL | Status: DC
  Filled 2019-12-31: qty 296

## 2019-12-31 MED ORDER — CHLORHEXIDINE GLUCONATE 4 % EX LIQD: 60.0000 mL | Freq: Once | CUTANEOUS | Status: DC

## 2019-12-31 MED ORDER — HALOPERIDOL 1 MG PO TABS: 5.0000 mg | ORAL_TABLET | Freq: Four times a day (QID) | ORAL | Status: DC | PRN

## 2019-12-31 MED ORDER — CEFAZOLIN SODIUM-DEXTROSE 2-4 GM/100ML-% IV SOLN: 2.0000 g | INTRAVENOUS | Status: AC

## 2019-12-31 MED ORDER — HALOPERIDOL LACTATE 5 MG/ML IJ SOLN: 10.0000 mg | Freq: Four times a day (QID) | INTRAMUSCULAR | Status: DC | PRN

## 2019-12-31 NOTE — Progress Notes (Signed)
RN called to pt's room by sitter. Pt had a bag off food and refused to give it to RN or NT. RN educated PT on not being able to have Sx today. Pt stated he did not care, if he had to go without food he did not want sx. Last snack was consumed by pt at 0415. RN went into pt's room and moved bag of food away from pt after pt fell asleep. Will let pass on to dayshift RN.

## 2019-12-31 NOTE — Progress Notes (Signed)
PT Cancellation Note  Patient Details Name: Paul Bender MRN: [id number] DOB: 1990/11/05   Cancelled Treatment:    Reason Eval/Treat Not Completed: Patient declined, no reason specified. Pt declining at this time, requesting to wait until after surgery to mobilize. PT family member present and with questions about WB status, PT provides further education that RLE is NWB and that LLE is WBAT for transfers only. PT informs pt and family that transfers consist of turning to bed/chair/wheelchair, and do not include ambulating to bathroom or out of the room. PT will attempt to follow up once surgery is complete and pt is medically stable.   Arlyss Gandy 12/31/2019, 2:28 PM

## 2019-12-31 NOTE — Progress Notes (Signed)
CHART REVIEWED PT ATE PRIOR TO SURGERY TODAY PT WITH COMPLEX THUMB METACARPAL FRACTURE/DISLOCATION NOW SEVERAL WEEKS OUT FROM INJURY. MY GOAL TODAY WAS TO TRY AND RESTORE CONGRUENCY OF JOINT KNOWING MORE LIKELY THAN NOT TO STABILIZE HIS THUMB HE WOULD LIKELY NEED AN ARTHRODESIS OF THE THUMB BASILAR JOINT. WILL DISCUSS WITH PARTNERS TO SEE IF TIME AVAILABLE TO ADDRESS THE THUMB THIS WEEK OTHERWISE WILL ATTEMPT TO ADDRESS THUMB EARLY NEXT WEEK.
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
## 2019-12-31 NOTE — TOC Progression Note (Signed)
Transition of Care [facility]) - Progression Note    Patient Details  Name: MONICO SUDDUTH MRN: [id number] Date of Birth: 1990-11-25  Transition of Care [facility]) CM/SW Contact  Astrid Drafts Berna Spare, RN Phone Number: 12/31/2019, 4:44 PM  Clinical Narrative: IVC paperwork renewal completed; notified [employer] for service only of IVC forms.        Expected Discharge Plan: Psychiatric Hospital Barriers to Discharge: Continued Medical Work up  Expected Discharge Plan and Services Expected Discharge Plan: Psychiatric Hospital   Discharge Planning Services: CM Consult   Living arrangements for the past 2 months: Single Family Home                                       Social Determinants of Health (SDOH) Interventions    Readmission Risk Interventions No flowsheet data found.  Quintella Baton, RN, BSN  Trauma/Neuro ICU Case Manager [phone]
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
## 2020-01-01 DIAGNOSIS — F332 Major depressive disorder, recurrent severe without psychotic features: Secondary | ICD-10-CM | POA: Diagnosis present
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
## 2020-01-02 NOTE — Progress Notes (Signed)
Orthopedic Tech Progress Note Patient Details:  Paul Bender 1990-10-17 [id number] RN called requesting for another NIGHT SPLINT/more velcro . I told her that the night splint isn't meant to be walked on so I ordered him a WALKING PRAFO BOOT also. Patient ID: Paul Bender, male   DOB: Aug 08, 1990, 29 y.o.   MRN: [id number]   [name] 01/02/2020, 11:44 AM
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
## 2020-01-03 ENCOUNTER — Inpatient Hospital Stay (HOSPITAL_COMMUNITY): Payer: No Typology Code available for payment source

## 2020-01-03 MED ORDER — LORAZEPAM 2 MG/ML IJ SOLN
INTRAMUSCULAR | Status: AC
  Administered 2020-01-03: 2 mg via INTRAMUSCULAR
  Filled 2020-01-03: qty 1

## 2020-01-03 MED ORDER — LORAZEPAM 2 MG/ML IJ SOLN: 2.0000 mg | Freq: Once | INTRAMUSCULAR | Status: DC

## 2020-01-03 MED ORDER — HALOPERIDOL 1 MG PO TABS
5.0000 mg | ORAL_TABLET | Freq: Three times a day (TID) | ORAL | Status: DC
  Administered 2020-01-03 (×2): 5 mg via ORAL
  Filled 2020-01-03 (×2): qty 5

## 2020-01-03 MED ORDER — LORAZEPAM 2 MG/ML IJ SOLN
2.0000 mg | Freq: Four times a day (QID) | INTRAMUSCULAR | Status: DC | PRN
  Administered 2020-01-04 – 2020-01-08 (×5): 2 mg via INTRAMUSCULAR
  Filled 2020-01-03 (×6): qty 1

## 2020-01-03 MED ORDER — HALOPERIDOL LACTATE 5 MG/ML IJ SOLN
5.0000 mg | Freq: Once | INTRAMUSCULAR | Status: AC
  Administered 2020-01-03: 5 mg via INTRAMUSCULAR
  Filled 2020-01-03: qty 1

## 2020-01-03 MED ORDER — CLONAZEPAM 0.5 MG PO TBDP
1.0000 mg | ORAL_TABLET | Freq: Two times a day (BID) | ORAL | Status: DC
  Administered 2020-01-03: 1 mg via ORAL
  Filled 2020-01-03: qty 2

## 2020-01-03 NOTE — Consult Note (Addendum)
Patient needs to be more medically stable and be able to ambulate with not need for PT for a psychiatric hospital unless there is availability at a psych hospital.  None in this area that I am aware of.  Therefore, I recommend he go to physical rehab and once stable, transferring to psych.  Nanine Means, PMHNP Patient seen face-to-face for psychiatric evaluation, chart reviewed and case discussed with the physician extender and developed treatment plan. Reviewed the information documented and agree with the treatment plan. Thedore Mins, MD
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
## 2020-01-03 NOTE — Progress Notes (Signed)
Ortho Trauma Note  Patient was seen ambulating in his room. He should not be putting weight on the leg. When I asked him regarding WB he said he is not having pain and wants to get discharged.  O: Wounds healed. Full ROM of hips and knees, No active DF on LLE  Imaging: Ordered Judet views today which show stable appearance  A/P R acetabular fracture and L segmental tibia fracture  I am concerned about his noncompliance. I worry that his fixation will fail and he will be high risk for posttraumatic arthritis. Although patient is full weightbearing, I will not officially recommend weightbearing until at least another 2-3 weeks. Patient will not be compliant with WB even with repeated redirection. Patients orthopaedic injuries are currently stable and there is logistically no difference between the patients current state of noncompliance and when I will release him to weightbear in 2-3 weeks. As a result, I feel that he could be discharged to psychiatric facility from an orthopaedic perspective. I will not, however, say that it is okay to weightbear until I feel enough time and healing has passed (which will be at least 6 weeks postoperatively-June 24th). I do understand that he will continue to be noncompliant and I relayed the chance of developing posttraumatic arthritis in the near future. He stated multiple times today that he doesn't care and just wants to leave. Please contact me with further questions.  Roby Lofts, MD [facility] [phone] (office) [email or web address]

## 2020-01-04 MED ORDER — HALOPERIDOL LACTATE 5 MG/ML IJ SOLN
INTRAMUSCULAR | Status: AC
  Filled 2020-01-04: qty 2

## 2020-01-04 MED ORDER — STERILE WATER FOR INJECTION IJ SOLN
INTRAMUSCULAR | Status: AC
  Filled 2020-01-04: qty 10

## 2020-01-04 MED ORDER — HALOPERIDOL 1 MG PO TABS
5.0000 mg | ORAL_TABLET | Freq: Two times a day (BID) | ORAL | Status: DC
  Administered 2020-01-04 – 2020-01-09 (×11): 5 mg via ORAL
  Filled 2020-01-04 (×11): qty 5

## 2020-01-04 MED ORDER — ZIPRASIDONE MESYLATE 20 MG IM SOLR
20.0000 mg | Freq: Once | INTRAMUSCULAR | Status: AC
  Administered 2020-01-04: 20 mg via INTRAMUSCULAR
  Filled 2020-01-04: qty 20

## 2020-01-04 MED ORDER — CLONAZEPAM 0.5 MG PO TBDP
0.5000 mg | ORAL_TABLET | Freq: Two times a day (BID) | ORAL | Status: DC
  Administered 2020-01-04 – 2020-01-09 (×11): 0.5 mg via ORAL
  Filled 2020-01-04 (×11): qty 1

## 2020-01-04 MED ORDER — HALOPERIDOL LACTATE 5 MG/ML IJ SOLN
10.0000 mg | Freq: Four times a day (QID) | INTRAMUSCULAR | Status: DC | PRN
  Administered 2020-01-04 – 2020-01-07 (×2): 10 mg via INTRAMUSCULAR
  Filled 2020-01-04: qty 2

## 2020-01-04 NOTE — Consult Note (Signed)
Spoke to [employer] who requested for medication management for patient who is being defiant, aggressive and getting agitated easily. Patient is currently on 5 mg Haldol twice daily, Haldol and Lorazepam as needed for agitation. To avoid QTc prolongation, it is not advisable to increased anti-psychotic at this time. Recommendations: -Continue Lorazepam 2 mg can be given as needed every 6 hrs for agitation. -Patient will benefit from inpatient psychiatric admission after he is medically cleared. -Consider social worker consult to facilitate inpatient psychiatric hospital placement. -Re-consult psychiatric service as needed.  Thedore Mins, MD Attending psychiatrist.

## 2020-01-04 NOTE — Progress Notes (Signed)
Pt is not restrained at this time, resting- will answer questions, apologizing for swearing at staff.

## 2020-01-04 NOTE — Progress Notes (Signed)
Spoke with Dr. Jannifer Franklin from [facility]- states that if pt is medically stable to contact social worker/case management to initiate inpatient psych placement again.  Explained that pt was ambulatory all the way down the hall without any assistance. Has been ambulating in room, to bathroom, independent with ADLs.  Dr. Bedelia Person aware,

## 2020-01-04 NOTE — Progress Notes (Signed)
[facility] notified- requesting provider to call TRN back
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
## 2020-01-04 NOTE — Plan of Care (Signed)

## 2020-01-04 NOTE — Progress Notes (Signed)
Pt continuing to spit and thrash in bed, states "You bitches, I am going to sue this place! I want to leave here, you can't keep me! Fuck you! Leave me alone!" Dr Bedelia Person aware- orders received,   Geodon 20mg  IM in left deltoid.

## 2020-01-04 NOTE — Tx Team (Signed)
TRN note- Pt walking down hall, refusing to go into room, has current IVC paperwork on file. Is attempting to leave- security and [employer] notified. Pt is laying in hallway at exit doors- refusing to get up, Dr. Bedelia Person notified for orders- Haldol 10MG  IM given-- bed from room brought to pt- physically assisted into bed with security and this nurse.   Pt placed in 3 pt restraints, no restraint on left leg where splint is.

## 2020-01-04 NOTE — Progress Notes (Signed)
Patient became very upset that surgery was cancelled and he feels as if he was NPO for nothing voicing that he was about to start acting out if he couldn't get food however the surgeon had already placed a diet order so that the patient could place his meal order. After meal order was placed and delivered, patient stated he didn't want food he just wanted to be discharged. He said he can do rehab from home and go to behavior meeting at home also. This writer tried to calm and redirect patient letting him know he couldn't just leave, patient started saying he was going to kill himself with something in the room and he couldn't live like this, he hasn't  seen his 5 children in a month. He called his girlfriend telling her he was going to kill himself and was noticed by [facility] sitter tying the stretch band around left arm stating he was trying to cut off circulation, I asked the patient to please remove band and he refused so I cut the band off placing it in the trash. This Clinical research associate contacted the MD to notify of behaviors asking is there a way to contact Psych so that they can see his behaviors, because he clearly is not stable to go to rehab then to inpatient psych, provider stated that he was re-consulting  Psych plus adding new orders. Later in the afternoon patient was observed by Ortho DR walking which is against recommended orders, the provider addressed with patient why he shouldn't be up walking and patient continued to tell him he just wanted to be discharged or sent to the mental hospital he didn't care about the recommendations and that he will deal with that problem when it occurs. Patient did calm down for awhile, but later observed by another nurse wrapping his arm with tape, upon removing the tape he was observed to have tied a sock around arm before wrapping with tape stating "I'm trying to cut off my circulation". Patient is frustrated stating he has been in this trauma room for a month and is ready to go to  the inpatient psych. Patient continued saying if he has to keep being here he needs to just kill himself, asking this Clinical research associate to give him another shot of the medicine given to put him to sleep. At shift change this writer was called to patient's room by [facility] sitter stating the patient was masturbating, upon entering the room patient was not masturbating but stated "Its been a month and I need to bust one". this Clinical research associate asked him if he could do that in the bathroom or ask the sitter turn their head, because I can't stop him but I can ask that he is respectful about doing it.

## 2020-01-05 ENCOUNTER — Inpatient Hospital Stay (HOSPITAL_COMMUNITY): Payer: No Typology Code available for payment source | Admitting: Anesthesiology

## 2020-01-05 ENCOUNTER — Encounter (HOSPITAL_COMMUNITY)
Admission: EM | Disposition: A | Discharge status: Other Acute Inpatient Hospital | Payer: Self-pay | Source: Home / Self Care

## 2020-01-05 ENCOUNTER — Inpatient Hospital Stay (HOSPITAL_COMMUNITY): Payer: No Typology Code available for payment source

## 2020-01-05 HISTORY — PX: PERCUTANEOUS PINNING: SHX2209

## 2020-01-05 SURGERY — PINNING, EXTREMITY, PERCUTANEOUS
Anesthesia: General | Site: Thumb | Laterality: Left

## 2020-01-05 MED ORDER — ONDANSETRON HCL 4 MG/2ML IJ SOLN
INTRAMUSCULAR | Status: DC | PRN
  Administered 2020-01-05: 4 mg via INTRAVENOUS

## 2020-01-05 MED ORDER — OXYCODONE HCL 5 MG PO TABS
5.0000 mg | ORAL_TABLET | Freq: Once | ORAL | Status: AC | PRN
  Administered 2020-01-05: 5 mg via ORAL

## 2020-01-05 MED ORDER — MEPERIDINE HCL 25 MG/ML IJ SOLN: 6.2500 mg | INTRAMUSCULAR | Status: DC | PRN

## 2020-01-05 MED ORDER — ONDANSETRON HCL 4 MG/2ML IJ SOLN: 4.0000 mg | Freq: Once | INTRAMUSCULAR | Status: DC | PRN

## 2020-01-05 MED ORDER — FENTANYL CITRATE (PF) 100 MCG/2ML IJ SOLN: 25.0000 ug | INTRAMUSCULAR | Status: DC | PRN

## 2020-01-05 MED ORDER — 0.9 % SODIUM CHLORIDE (POUR BTL) OPTIME
TOPICAL | Status: DC | PRN
  Administered 2020-01-05: 1000 mL

## 2020-01-05 MED ORDER — CEFAZOLIN SODIUM-DEXTROSE 1-4 GM/50ML-% IV SOLN
1.0000 g | Freq: Three times a day (TID) | INTRAVENOUS | Status: DC
  Administered 2020-01-06 (×4): 1 g via INTRAVENOUS
  Filled 2020-01-05 (×6): qty 50

## 2020-01-05 MED ORDER — MIDAZOLAM HCL 2 MG/2ML IJ SOLN
0.5000 mg | Freq: Once | INTRAMUSCULAR | Status: AC | PRN
  Administered 2020-01-05: 2 mg via INTRAVENOUS

## 2020-01-05 MED ORDER — FENTANYL CITRATE (PF) 250 MCG/5ML IJ SOLN
INTRAMUSCULAR | Status: AC
  Filled 2020-01-05: qty 5

## 2020-01-05 MED ORDER — HYDROMORPHONE HCL 1 MG/ML IJ SOLN
0.2500 mg | INTRAMUSCULAR | Status: DC | PRN
  Administered 2020-01-05 (×2): 0.5 mg via INTRAVENOUS

## 2020-01-05 MED ORDER — MIDAZOLAM HCL 5 MG/5ML IJ SOLN
INTRAMUSCULAR | Status: DC | PRN
  Administered 2020-01-05: 2 mg via INTRAVENOUS

## 2020-01-05 MED ORDER — DEXAMETHASONE SODIUM PHOSPHATE 10 MG/ML IJ SOLN
INTRAMUSCULAR | Status: DC | PRN
  Administered 2020-01-05: 10 mg via INTRAVENOUS

## 2020-01-05 MED ORDER — CHLORHEXIDINE GLUCONATE 4 % EX LIQD
60.0000 mL | Freq: Once | CUTANEOUS | Status: DC
  Filled 2020-01-05: qty 60
  Filled 2020-01-05: qty 15

## 2020-01-05 MED ORDER — PROPOFOL 10 MG/ML IV BOLUS
INTRAVENOUS | Status: AC
  Filled 2020-01-05: qty 20

## 2020-01-05 MED ORDER — CEFAZOLIN SODIUM-DEXTROSE 1-4 GM/50ML-% IV SOLN
1.0000 g | INTRAVENOUS | Status: AC
  Filled 2020-01-05: qty 50

## 2020-01-05 MED ORDER — CEFAZOLIN SODIUM-DEXTROSE 2-4 GM/100ML-% IV SOLN
INTRAVENOUS | Status: AC
  Filled 2020-01-05: qty 100

## 2020-01-05 MED ORDER — LABETALOL HCL 5 MG/ML IV SOLN
10.0000 mg | Freq: Once | INTRAVENOUS | Status: AC
  Administered 2020-01-05: 10 mg via INTRAVENOUS

## 2020-01-05 MED ORDER — MIDAZOLAM HCL 2 MG/2ML IJ SOLN
INTRAMUSCULAR | Status: AC
  Filled 2020-01-05: qty 2

## 2020-01-05 MED ORDER — ONDANSETRON HCL 4 MG/2ML IJ SOLN
INTRAMUSCULAR | Status: AC
  Filled 2020-01-05: qty 2

## 2020-01-05 MED ORDER — LABETALOL HCL 5 MG/ML IV SOLN
INTRAVENOUS | Status: AC
  Filled 2020-01-05: qty 4

## 2020-01-05 MED ORDER — ACETAMINOPHEN 325 MG PO TABS: 325.0000 mg | ORAL_TABLET | ORAL | Status: DC | PRN

## 2020-01-05 MED ORDER — PROMETHAZINE HCL 25 MG/ML IJ SOLN: 6.2500 mg | INTRAMUSCULAR | Status: DC | PRN

## 2020-01-05 MED ORDER — DEXAMETHASONE SODIUM PHOSPHATE 10 MG/ML IJ SOLN
INTRAMUSCULAR | Status: AC
  Filled 2020-01-05: qty 1

## 2020-01-05 MED ORDER — OXYCODONE HCL 5 MG/5ML PO SOLN: 5.0000 mg | Freq: Once | ORAL | Status: DC | PRN

## 2020-01-05 MED ORDER — LACTATED RINGERS IV SOLN: INTRAVENOUS | Status: DC | PRN

## 2020-01-05 MED ORDER — OXYCODONE HCL 5 MG PO TABS: 5.0000 mg | ORAL_TABLET | Freq: Once | ORAL | Status: DC | PRN

## 2020-01-05 MED ORDER — LACTATED RINGERS IV SOLN: INTRAVENOUS | Status: DC

## 2020-01-05 MED ORDER — CEFAZOLIN SODIUM-DEXTROSE 2-4 GM/100ML-% IV SOLN: 2.0000 g | Freq: Once | INTRAVENOUS | Status: DC

## 2020-01-05 MED ORDER — FENTANYL CITRATE (PF) 100 MCG/2ML IJ SOLN
INTRAMUSCULAR | Status: DC | PRN
  Administered 2020-01-05 (×5): 50 ug via INTRAVENOUS

## 2020-01-05 MED ORDER — OXYCODONE HCL 5 MG PO TABS
ORAL_TABLET | ORAL | Status: AC
  Filled 2020-01-05: qty 1

## 2020-01-05 MED ORDER — CEFAZOLIN SODIUM-DEXTROSE 2-4 GM/100ML-% IV SOLN: 2.0000 g | INTRAVENOUS | Status: DC

## 2020-01-05 MED ORDER — OXYCODONE HCL 5 MG/5ML PO SOLN: 5.0000 mg | Freq: Once | ORAL | Status: AC | PRN

## 2020-01-05 MED ORDER — ACETAMINOPHEN 160 MG/5ML PO SOLN: 325.0000 mg | ORAL | Status: DC | PRN

## 2020-01-05 MED ORDER — HYDROMORPHONE HCL 1 MG/ML IJ SOLN
INTRAMUSCULAR | Status: AC
  Filled 2020-01-05: qty 1

## 2020-01-05 MED ORDER — POVIDONE-IODINE 10 % EX SWAB: 2.0000 "application " | Freq: Once | CUTANEOUS | Status: DC

## 2020-01-05 MED ORDER — LORAZEPAM 1 MG PO TABS
2.0000 mg | ORAL_TABLET | Freq: Four times a day (QID) | ORAL | Status: DC | PRN
  Administered 2020-01-06 – 2020-01-09 (×7): 2 mg via ORAL
  Filled 2020-01-05 (×7): qty 2

## 2020-01-05 MED ORDER — LIDOCAINE 2% (20 MG/ML) 5 ML SYRINGE
INTRAMUSCULAR | Status: DC | PRN
  Administered 2020-01-05: 30 mg via INTRAVENOUS

## 2020-01-05 MED ORDER — PROPOFOL 10 MG/ML IV BOLUS
INTRAVENOUS | Status: DC | PRN
  Administered 2020-01-05: 200 mg via INTRAVENOUS

## 2020-01-05 SURGICAL SUPPLY — 46 items
BENZOIN TINCTURE PRP APPL 2/3 (GAUZE/BANDAGES/DRESSINGS) IMPLANT
BLADE CLIPPER SURG (BLADE) IMPLANT
BNDG ELASTIC 2X5.8 VLCR STR LF (GAUZE/BANDAGES/DRESSINGS) ×3 IMPLANT
BNDG ELASTIC 3X5.8 VLCR STR LF (GAUZE/BANDAGES/DRESSINGS) ×3 IMPLANT
BNDG ELASTIC 4X5.8 VLCR STR LF (GAUZE/BANDAGES/DRESSINGS) ×3 IMPLANT
BNDG ESMARK 4X9 LF (GAUZE/BANDAGES/DRESSINGS) ×3 IMPLANT
BNDG GAUZE ELAST 4 BULKY (GAUZE/BANDAGES/DRESSINGS) ×3 IMPLANT
CLOSURE WOUND 1/2 X4 (GAUZE/BANDAGES/DRESSINGS)
COVER SURGICAL LIGHT HANDLE (MISCELLANEOUS) ×3 IMPLANT
COVER WAND RF STERILE (DRAPES) ×3 IMPLANT
CUFF TOURN SGL QUICK 18X4 (TOURNIQUET CUFF) IMPLANT
CUFF TOURN SGL QUICK 24 (TOURNIQUET CUFF)
CUFF TRNQT CYL 24X4X16.5-23 (TOURNIQUET CUFF) IMPLANT
DRAPE OEC MINIVIEW 54X84 (DRAPES) ×3 IMPLANT
DRSG EMULSION OIL 3X3 NADH (GAUZE/BANDAGES/DRESSINGS) ×3 IMPLANT
DRSG XEROFORM 1X8 (GAUZE/BANDAGES/DRESSINGS) ×3 IMPLANT
GAUZE SPONGE 4X4 12PLY STRL (GAUZE/BANDAGES/DRESSINGS) ×3 IMPLANT
GAUZE XEROFORM 1X8 LF (GAUZE/BANDAGES/DRESSINGS) IMPLANT
GLOVE BIOGEL PI IND STRL 8.5 (GLOVE) ×1 IMPLANT
GLOVE BIOGEL PI INDICATOR 8.5 (GLOVE) ×2
GLOVE SURG ORTHO 8.0 STRL STRW (GLOVE) ×3 IMPLANT
GOWN STRL REUS W/ TWL LRG LVL3 (GOWN DISPOSABLE) ×2 IMPLANT
GOWN STRL REUS W/TWL LRG LVL3 (GOWN DISPOSABLE) ×4
K-WIRE DBL TROCAR .045X4 (WIRE) ×9
KIT BASIN OR (CUSTOM PROCEDURE TRAY) ×3 IMPLANT
KIT TURNOVER KIT B (KITS) ×3 IMPLANT
KWIRE DBL TROCAR .045X4 (WIRE) ×3 IMPLANT
NS IRRIG 1000ML POUR BTL (IV SOLUTION) ×3 IMPLANT
PACK ORTHO EXTREMITY (CUSTOM PROCEDURE TRAY) ×3 IMPLANT
PAD ARMBOARD 7.5X6 YLW CONV (MISCELLANEOUS) ×6 IMPLANT
PAD CAST 3X4 CTTN HI CHSV (CAST SUPPLIES) ×1 IMPLANT
PAD CAST 4YDX4 CTTN HI CHSV (CAST SUPPLIES) ×1 IMPLANT
PADDING CAST COTTON 3X4 STRL (CAST SUPPLIES) ×2
PADDING CAST COTTON 4X4 STRL (CAST SUPPLIES) ×2
SOAP 2 % CHG 4 OZ (WOUND CARE) ×3 IMPLANT
SPLINT FIBERGLASS 3X12 (CAST SUPPLIES) ×3 IMPLANT
STRIP CLOSURE SKIN 1/2X4 (GAUZE/BANDAGES/DRESSINGS) IMPLANT
SUT ETHILON 4 0 P 3 18 (SUTURE) IMPLANT
SUT ETHILON 5 0 P 3 18 (SUTURE)
SUT NYLON ETHILON 5-0 P-3 1X18 (SUTURE) IMPLANT
SUT PROLENE 4 0 P 3 18 (SUTURE) IMPLANT
SUT VIC AB 3-0 FS2 27 (SUTURE) ×3 IMPLANT
SUT VICRYL RAPIDE 4/0 PS 2 (SUTURE) ×3 IMPLANT
TOWEL GREEN STERILE (TOWEL DISPOSABLE) ×3 IMPLANT
TOWEL GREEN STERILE FF (TOWEL DISPOSABLE) ×3 IMPLANT
WATER STERILE IRR 1000ML POUR (IV SOLUTION) ×3 IMPLANT
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
## 2020-01-05 NOTE — TOC Progression Note (Signed)
Transition of Care [facility]) - Progression Note    Patient Details  Name: Paul Bender MRN: [id number] Date of Birth: Dec 17, 1990  Transition of [facility]) CM/SW Contact  Glennon Mac, RN Phone Number: 01/05/2020, 10:00 AM  Clinical Narrative:  Noted pt ambulating independently, eating regular foods.  Psych recommending pt be reconsidered for IP psych facilities.  Referrals made to [facility] and [facility].  Will follow with updates as available.       Expected Discharge Plan: Psychiatric Hospital Barriers to Discharge: Continued Medical Work up  Expected Discharge Plan and Services Expected Discharge Plan: Psychiatric Hospital   Discharge Planning Services: CM Consult   Living arrangements for the past 2 months: Single Family Home                                       Social Determinants of Health (SDOH) Interventions    Readmission Risk Interventions No flowsheet data found.  Quintella Baton, RN, BSN  Trauma/Neuro ICU Case Manager [phone]

## 2020-01-05 NOTE — Consult Note (Signed)
The patient was seen and evaluated today. I had a long discussion with the patient today. 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 We talked about the reason the rationale for open stabilization of the [facility] joint.  We talked about trying to make her thumb CMC joint more congruent. This is not going to make the thumb normal. Likely K wire fixation across the [facility] joint for period of time to allow for stiffness and congruency of the joint Risk benefits alternatives discussed in detail with the patient.  Risks include but not limited to bleeding infection damage nearby nerves arteries or tendons nonunion malunion hardware failure loss of motion of the wrist and digits incomplete relief of symptoms and need for further surgical invention. Patient would like to move forward.  Signed informed consent was obtained.

## 2020-01-05 NOTE — Plan of Care (Signed)

## 2020-01-05 NOTE — Anesthesia Postprocedure Evaluation (Signed)
Anesthesia Post Note  Patient: Paul Bender  Procedure(s) Performed: OPEN REDUCTION INTERNAL FIXATION (ORIF) FINGER THUMB (Left Thumb)     Patient location during evaluation: PACU Anesthesia Type: General Level of consciousness: awake and alert Pain management: pain level controlled Vital Signs Assessment: post-procedure vital signs reviewed and stable Respiratory status: spontaneous breathing, nonlabored ventilation, respiratory function stable and patient connected to nasal cannula oxygen Cardiovascular status: blood pressure returned to baseline and stable Postop Assessment: no apparent nausea or vomiting Anesthetic complications: no   No complications documented.  Last Vitals:  Vitals:   01/05/20 1831 01/05/20 2019  BP: (!) 140/98 (!) 153/94  Pulse: (!) 101 (!) 105  Resp: 15 16  Temp: 36.8 C 37 C  SpO2: 100% 98%    Last Pain:  Vitals:   01/05/20 2019  TempSrc: Oral  PainSc:                  Paul Bender

## 2020-01-05 NOTE — Progress Notes (Signed)
Pt. Is very anxious, despite receiving medicine at 0000. Has gotten up repeatedly, asking to shower now and eat more food.

## 2020-01-05 NOTE — Transfer of Care (Signed)
Immediate Anesthesia Transfer of Care Note  Patient: Paul Bender  Procedure(s) Performed: OPEN REDUCTION INTERNAL FIXATION (ORIF) FINGER THUMB (Left Thumb)  Patient Location: PACU  Anesthesia Type:General  Level of Consciousness: awake, alert , oriented and patient cooperative  Airway & Oxygen Therapy: Patient Spontanous Breathing and Patient connected to nasal cannula oxygen  Post-op Assessment: Report given to RN and Post -op Vital signs reviewed and stable  Post vital signs: Reviewed and stable  Last Vitals:  Vitals Value Taken Time  BP 156/108 01/05/20 1745  Temp    Pulse 128 01/05/20 1750  Resp 27 01/05/20 1750  SpO2 100 % 01/05/20 1750  Vitals shown include unvalidated device data.  Last Pain:  Vitals:   01/05/20 1745  TempSrc:   PainSc: (P) Asleep      Patients Stated Pain Goal: 0 (01/02/20 1453)  Complications: No complications documented.
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
## 2020-01-05 NOTE — TOC Progression Note (Signed)
Transition of Care [facility]) - Progression Note    Patient Details  Name: Paul Bender MRN: [id number] Date of Birth: November 01, 1990  Transition of Care [facility]) CM/SW Contact  Astrid Drafts Berna Spare, RN Phone Number: 01/05/2020, 4:28 PM  Clinical Narrative:  Pt declined by [facility]; still waiting on answer from [facility].  Faxed referral to alternate psychiatric facilities:  [place], [facility], [place], [facility], and [place].  Will provide updates as available.      Expected Discharge Plan: Psychiatric Hospital Barriers to Discharge: Continued Medical Work up  Expected Discharge Plan and Services Expected Discharge Plan: Psychiatric Hospital   Discharge Planning Services: CM Consult   Living arrangements for the past 2 months: Single Family Home                                       Social Determinants of Health (SDOH) Interventions    Readmission Risk Interventions No flowsheet data found.  Quintella Baton, RN, BSN  Trauma/Neuro ICU Case Manager [phone]

## 2020-01-06 ENCOUNTER — Encounter (HOSPITAL_COMMUNITY): Payer: Self-pay | Admitting: Orthopedic Surgery

## 2020-01-06 NOTE — Progress Notes (Signed)
Sitter called this RN into room after patient removed his splint and ace wrap from his left wrist. He was rubbing ice cubes on the incision to "take the swelling directly down." RN replaced the splint and wrap and educated patient on the need to keep them in place to reduce risk of infection and re-injury to his thumb. Will continue to monitor.   Allegra Grana RN

## 2020-01-06 NOTE — Progress Notes (Signed)
Pt had a good day today with no displays of inappropriate or aggressive behaviors. No behavioral PRNs needed. At the end of the shift, however, RN walked into room to find pt removing ace wrap and splint off of his left wrist. While it was off a radial pulse was felt at 2+ and then the splint was replaced and pt educated to keep it on.   Robina Ade, RN

## 2020-01-06 NOTE — Consult Note (Signed)
  Patient was seen and evaluated today.  He was observed sitting up in bed watching television.  He was appropriate, and engaged well with Clinical research associate.  Psychiatry has been consulted due to aggressive behaviors. The patient denies any recent or overt aggressive behaviors.  His safety sitter at bedside concurs with this statement.  Psychiatry also placed a note on June 13 in regards to aggressive behaviors and appropriate medication.  Please refer to this note.  At this time patient continues to meet recommendations for inpatient psychiatry placement.  Practitioner did discuss with medical director the patient is ambulatory and able to manage ADLs independently and will be appropriate for admission to inpatient psychiatry hospital.  Patient is eager to begin therapy so that he can return home.  This information has been communicated to appropriate personnel.  We will continue to work closely with hospital social work regarding placement to inpatient facility.

## 2020-01-06 NOTE — Progress Notes (Signed)
PT Cancellation/Discharge Note  Patient Details Name: Paul Bender MRN: [id number] DOB: 08/08/1990   Cancelled Treatment:    Reason Eval/Treat Not Completed: Other (comment).  I discussed case with RN, OTR-L Wendi Conarpe, and patient.  He is doing everything that he can do within his restrictions at an independent level.  He can report his WB status of both legs (NWB R and WBAT left for transfers only).  When I asked him why he was walking in the hallways without a device (essentially breaking all of his precautions) he reported "because it doesn't hurt".  I educated him on why the WB precautions were in place and the risk he is taking by not abiding by them (non-union, poor healing, more surgery).  He continues to walk independently to the bathroom and into the hallway.  Until his WB status is updated, and PT can challenge him more, he has no current acute PT needs.  Wendi, OT, is on trauma rounds and will ensure we are re-ordered at the appropriate time when his WB status has been updated (possibly next Thursday 01/15/20).  PT to sign off.   Thanks,  Corinna Capra, PT, DPT  Acute Rehabilitation [phone] pager #[phone] office       [name] 01/06/2020, 1:27 PM
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
## 2020-01-07 LAB — COMPREHENSIVE METABOLIC PANEL WITH GFR
ALT: 75 U/L — ABNORMAL HIGH (ref 0–44)
AST: 44 U/L — ABNORMAL HIGH (ref 15–41)
Albumin: 3.4 g/dL — ABNORMAL LOW (ref 3.5–5.0)
Alkaline Phosphatase: 252 U/L — ABNORMAL HIGH (ref 38–126)
Anion gap: 12 (ref 5–15)
BUN: 14 mg/dL (ref 6–20)
CO2: 29 mmol/L (ref 22–32)
Calcium: 9.4 mg/dL (ref 8.9–10.3)
Chloride: 94 mmol/L — ABNORMAL LOW (ref 98–111)
Creatinine, Ser: 0.84 mg/dL (ref 0.61–1.24)
GFR calc Af Amer: >60 mL/min (ref >60)
GFR calc non Af Amer: >60 mL/min (ref >60)
Glucose, Bld: 101 mg/dL — ABNORMAL HIGH (ref 70–99)
Potassium: 3.9 mmol/L (ref 3.5–5.1)
Sodium: 135 mmol/L (ref 135–145)
Total Bilirubin: 0.5 mg/dL (ref 0.3–1.2)
Total Protein: 6.7 g/dL (ref 6.5–8.1)

## 2020-01-07 LAB — CBC
HCT: 32.1 % — ABNORMAL LOW (ref 39.0–52.0)
Hemoglobin: 10.5 g/dL — ABNORMAL LOW (ref 13.0–17.0)
MCH: 29.4 pg (ref 26.0–34.0)
MCHC: 32.7 g/dL (ref 30.0–36.0)
MCV: 89.9 fL (ref 80.0–100.0)
Platelets: 259 K/uL (ref 150–400)
RBC: 3.57 MIL/uL — ABNORMAL LOW (ref 4.22–5.81)
RDW: 15.3 % (ref 11.5–15.5)
WBC: 7.2 K/uL (ref 4.0–10.5)
nRBC: 0 % (ref 0.0–0.2)

## 2020-01-07 MED ORDER — DIPHENHYDRAMINE HCL 50 MG/ML IJ SOLN
25.0000 mg | Freq: Once | INTRAMUSCULAR | Status: AC
  Administered 2020-01-07: 25 mg via INTRAMUSCULAR
  Filled 2020-01-07: qty 1
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
## 2020-01-07 NOTE — Progress Notes (Signed)
   01/07/20 0421  Assess: MEWS Score  Temp (!) 101.6 F (38.7 C)  BP 123/69  Pulse Rate (!) 119  Resp 18  Level of Consciousness Alert  SpO2 98 %  O2 Device Room Air  Assess: MEWS Score  MEWS Temp 2  MEWS Systolic 0  MEWS Pulse 2  MEWS RR 0  MEWS LOC 0  MEWS Score 4  MEWS Score Color Red  Assess: if the MEWS score is Yellow or Red  Were vital signs taken at a resting state? Yes  Focused Assessment Documented focused assessment  Early Detection of Sepsis Score *See Row Information* Medium  MEWS guidelines implemented *See Row Information* Yes  Treat  MEWS Interventions Escalated (See documentation below)  Take Vital Signs  Increase Vital Sign Frequency  Red: Q 1hr X 4 then Q 4hr X 4, if remains red, continue Q 4hrs  Escalate  MEWS: Escalate Red: discuss with charge nurse/RN and provider, consider discussing with RRT  Notify: Charge Nurse/RN  Name of Charge Nurse/RN Notified [place]   Date Charge Nurse/RN Notified 01/07/20  Time Charge Nurse/RN Notified 0421  Notify: Provider  Provider Name/Title Tseui (trauma MD)  Date Provider Notified 01/07/20  Time Provider Notified [phone]  Notification Type Page  Notification Reason Change in status (red MEWS)  Response See new orders  Date of Provider Response 01/07/20  Time of Provider Response 0440  Patient's MEWS score is red. Charge RN and On-call MD notified. New orders received for CBC and CMP. Will continue to monitor.  Allegra Grana RN
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
## 2020-01-07 NOTE — Progress Notes (Signed)
Pts mother came to unit to visit son, despite pt not being allowed to have visitors at this time. When told that she would not be able to visit him, she did seem understanding and agreeable to this. She also had brought his wallet and a card to give to him. RN advised her to take wallet back home and agreed to give card to pt.  Robina Ade, RN
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
## 2020-01-07 NOTE — Progress Notes (Signed)
Patient removed IV. This RN and another RN  attempted to re-establish access. Patient ultimately refused IV. Antibiotic not given. Patient educated, but still refused.  Allegra Grana RN

## 2020-01-08 ENCOUNTER — Inpatient Hospital Stay (HOSPITAL_COMMUNITY): Payer: No Typology Code available for payment source

## 2020-01-08 DIAGNOSIS — R609 Edema, unspecified: Secondary | ICD-10-CM

## 2020-01-08 LAB — URINALYSIS, ROUTINE W REFLEX MICROSCOPIC
Bacteria, UA: NONE SEEN
Bilirubin Urine: NEGATIVE
Glucose, UA: NEGATIVE mg/dL
Ketones, ur: NEGATIVE mg/dL
Leukocytes,Ua: NEGATIVE
Nitrite: NEGATIVE
Protein, ur: NEGATIVE mg/dL
Specific Gravity, Urine: 1.008 (ref 1.005–1.030)
pH: 6 (ref 5.0–8.0)

## 2020-01-08 MED ORDER — IBUPROFEN 200 MG PO TABS
600.0000 mg | ORAL_TABLET | Freq: Once | ORAL | Status: AC
  Administered 2020-01-08: 600 mg via ORAL
  Filled 2020-01-08: qty 3
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
## 2020-01-08 NOTE — TOC Progression Note (Signed)
Transition of Care [facility]) - Progression Note    Patient Details  Name: Paul Bender MRN: [id number] Date of Birth: Apr 26, 1991  Transition of Care [facility]) CM/SW Contact  Astrid Drafts Berna Spare, RN Phone Number: 01/08/2020, 5:04 PM  Clinical Narrative:  IVC renewal completed; notified non-emergent [employer] for service only at 1646.  Continuing attempts to place patient at [facility]; left multiple messages for intake requesting update on bed placement/availability.  Will follow as available.     Expected Discharge Plan: Psychiatric Hospital Barriers to Discharge: Continued Medical Work up  Expected Discharge Plan and Services Expected Discharge Plan: Psychiatric Hospital   Discharge Planning Services: CM Consult   Living arrangements for the past 2 months: Single Family Home                                       Social Determinants of Health (SDOH) Interventions    Readmission Risk Interventions No flowsheet data found.  Quintella Baton, RN, BSN  Trauma/Neuro ICU Case Manager [phone]

## 2020-01-08 NOTE — Progress Notes (Addendum)
Pt requested "something for anxiety." PRN oral Ativan given. Pt states that he feels like he cannot get still or comfortable. He says he has felt restless and unable to sleep well since he has been here. He says "I just need help." Pt not irritable or aggressive at this time, just sad. Dr. Janee Morn was text-paged and said to contact Psychiatry for anything psychosis related.  Charge RN, Advertising account executive, reached out directly to behavioral health. They had already been consulted to see the pt today. Will await visit.  1659: Pt continuing to ask for help. He cannot receive PRN Ativan again until 2011 due to scheduled time frequency. Dr. Janee Morn has been text-paged again to ask him to re-order psych consult, and to make it STAT if possible. Per unit manager, will put in safety zone for extreme delay in pt being seen by psych.   1710: Dr. Janee Morn called this RN back and said to get the [facility] involved to get psych to come see pt so that he can get the help that he needs. He also agrees adamantly to the request to safety zone. According to Dr. Janee Morn, his order for behavioral heath should have psych coming to see pt daily. Last psych note from 2 days ago on 01/06/2020.   Robina Ade, RN

## 2020-01-08 NOTE — Progress Notes (Signed)
Venous duplex       has been completed. Preliminary results can be found under CV proc through chart review. Ivanell Deshotel, BS, RDMS, RVT   

## 2020-01-08 NOTE — Progress Notes (Signed)
Pt left unit to go to [place] with transport and sitter.   Robina Ade, RN
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
## 2020-01-09 ENCOUNTER — Other Ambulatory Visit: Payer: Self-pay

## 2020-01-09 ENCOUNTER — Inpatient Hospital Stay (HOSPITAL_COMMUNITY)
Admission: EM | Admit: 2020-01-09 | Discharge: 2020-01-10 | DRG: 885 | Disposition: A | Discharge status: Home / Self Care | Payer: No Typology Code available for payment source | Source: Other Acute Inpatient Hospital | Attending: Emergency Medicine | Admitting: Emergency Medicine

## 2020-01-09 ENCOUNTER — Encounter (HOSPITAL_COMMUNITY): Payer: Self-pay | Admitting: Psychiatry

## 2020-01-09 DIAGNOSIS — F419 Anxiety disorder, unspecified: Secondary | ICD-10-CM | POA: Diagnosis present

## 2020-01-09 DIAGNOSIS — L03116 Cellulitis of left lower limb: Secondary | ICD-10-CM | POA: Diagnosis present

## 2020-01-09 DIAGNOSIS — F39 Unspecified mood [affective] disorder: Secondary | ICD-10-CM | POA: Diagnosis not present

## 2020-01-09 DIAGNOSIS — F332 Major depressive disorder, recurrent severe without psychotic features: Secondary | ICD-10-CM | POA: Diagnosis present

## 2020-01-09 DIAGNOSIS — S32401D Unspecified fracture of right acetabulum, subsequent encounter for fracture with routine healing: Secondary | ICD-10-CM | POA: Diagnosis not present

## 2020-01-09 DIAGNOSIS — R7401 Elevation of levels of liver transaminase levels: Secondary | ICD-10-CM | POA: Diagnosis not present

## 2020-01-09 DIAGNOSIS — Z818 Family history of other mental and behavioral disorders: Secondary | ICD-10-CM

## 2020-01-09 DIAGNOSIS — R45851 Suicidal ideations: Secondary | ICD-10-CM

## 2020-01-09 DIAGNOSIS — D649 Anemia, unspecified: Secondary | ICD-10-CM | POA: Diagnosis not present

## 2020-01-09 DIAGNOSIS — Z20822 Contact with and (suspected) exposure to covid-19: Secondary | ICD-10-CM | POA: Diagnosis present

## 2020-01-09 DIAGNOSIS — Z79899 Other long term (current) drug therapy: Secondary | ICD-10-CM

## 2020-01-09 DIAGNOSIS — A419 Sepsis, unspecified organism: Secondary | ICD-10-CM

## 2020-01-09 DIAGNOSIS — S82202F Unspecified fracture of shaft of left tibia, subsequent encounter for open fracture type IIIA, IIIB, or IIIC with routine healing: Secondary | ICD-10-CM | POA: Diagnosis not present

## 2020-01-09 DIAGNOSIS — E878 Other disorders of electrolyte and fluid balance, not elsewhere classified: Secondary | ICD-10-CM | POA: Diagnosis not present

## 2020-01-09 DIAGNOSIS — G4700 Insomnia, unspecified: Secondary | ICD-10-CM | POA: Diagnosis present

## 2020-01-09 DIAGNOSIS — R739 Hyperglycemia, unspecified: Secondary | ICD-10-CM | POA: Diagnosis not present

## 2020-01-09 DIAGNOSIS — E871 Hypo-osmolality and hyponatremia: Secondary | ICD-10-CM | POA: Diagnosis not present

## 2020-01-09 DIAGNOSIS — R945 Abnormal results of liver function studies: Secondary | ICD-10-CM | POA: Diagnosis not present

## 2020-01-09 DIAGNOSIS — Z5329 Procedure and treatment not carried out because of patient's decision for other reasons: Secondary | ICD-10-CM | POA: Diagnosis not present

## 2020-01-09 LAB — SARS CORONAVIRUS 2 BY RT PCR (HOSPITAL ORDER, PERFORMED IN ~~LOC~~ HOSPITAL LAB): SARS Coronavirus 2: NEGATIVE

## 2020-01-09 MED ORDER — HYDROXYZINE HCL 25 MG PO TABS
25.0000 mg | ORAL_TABLET | Freq: Three times a day (TID) | ORAL | Status: DC | PRN
  Administered 2020-01-09: 25 mg via ORAL
  Filled 2020-01-09: qty 1

## 2020-01-09 MED ORDER — ACETAMINOPHEN 325 MG PO TABS
650.0000 mg | ORAL_TABLET | Freq: Four times a day (QID) | ORAL | Status: DC | PRN
  Administered 2020-01-09 – 2020-01-10 (×2): 650 mg via ORAL
  Filled 2020-01-09 (×2): qty 2

## 2020-01-09 MED ORDER — CHLORPROMAZINE HCL 25 MG/ML IJ SOLN: 50.0000 mg | Freq: Three times a day (TID) | INTRAMUSCULAR | Status: DC

## 2020-01-09 MED ORDER — TRAZODONE HCL 100 MG PO TABS
50.0000 mg | ORAL_TABLET | Freq: Every evening | ORAL | Status: DC | PRN
  Administered 2020-01-09: 50 mg via ORAL
  Filled 2020-01-09: qty 1

## 2020-01-09 MED ORDER — METHOCARBAMOL 500 MG PO TABS: 1000.0000 mg | ORAL_TABLET | Freq: Three times a day (TID) | ORAL | Status: DC | PRN

## 2020-01-09 MED ORDER — ZIPRASIDONE MESYLATE 20 MG IM SOLR: 20.0000 mg | INTRAMUSCULAR | Status: DC | PRN

## 2020-01-09 MED ORDER — DOCUSATE SODIUM 50 MG/5ML PO LIQD: 100.0000 mg | Freq: Two times a day (BID) | ORAL | 0 refills | Status: DC

## 2020-01-09 MED ORDER — LORAZEPAM 2 MG PO TABS: 2.0000 mg | ORAL_TABLET | Freq: Four times a day (QID) | ORAL | 0 refills | Status: DC | PRN

## 2020-01-09 MED ORDER — AMLODIPINE BESYLATE 10 MG PO TABS: 10.0000 mg | ORAL_TABLET | Freq: Every day | ORAL | Status: AC

## 2020-01-09 MED ORDER — CHLORPROMAZINE HCL 100 MG PO TABS
100.0000 mg | ORAL_TABLET | Freq: Three times a day (TID) | ORAL | Status: DC
  Administered 2020-01-09: 100 mg via ORAL
  Filled 2020-01-09 (×3): qty 1

## 2020-01-09 MED ORDER — OXYCODONE-ACETAMINOPHEN 5-325 MG PO TABS
1.0000 | ORAL_TABLET | Freq: Four times a day (QID) | ORAL | Status: DC | PRN
  Administered 2020-01-10: 2 via ORAL
  Filled 2020-01-09: qty 2

## 2020-01-09 MED ORDER — MAGNESIUM HYDROXIDE 400 MG/5ML PO SUSP: 30.0000 mL | Freq: Every day | ORAL | Status: DC | PRN

## 2020-01-09 MED ORDER — OLANZAPINE 10 MG PO TBDP: 10.0000 mg | ORAL_TABLET | Freq: Three times a day (TID) | ORAL | Status: DC | PRN

## 2020-01-09 MED ORDER — VALPROIC ACID 250 MG/5ML PO SOLN: 500.0000 mg | Freq: Two times a day (BID) | ORAL | Status: DC

## 2020-01-09 MED ORDER — VITAMIN D3 25 MCG PO TABS: 2000.0000 [IU] | ORAL_TABLET | Freq: Every day | ORAL | Status: DC

## 2020-01-09 MED ORDER — BACITRACIN-NEOMYCIN-POLYMYXIN OINTMENT TUBE
TOPICAL_OINTMENT | Freq: Two times a day (BID) | CUTANEOUS | Status: DC
  Administered 2020-01-09: 1 via TOPICAL
  Filled 2020-01-09 (×2): qty 14.17

## 2020-01-09 MED ORDER — ENOXAPARIN SODIUM 30 MG/0.3ML ~~LOC~~ SOLN: 30.0000 mg | Freq: Two times a day (BID) | SUBCUTANEOUS | Status: DC

## 2020-01-09 MED ORDER — BENZTROPINE MESYLATE 0.5 MG PO TABS: 0.5000 mg | ORAL_TABLET | Freq: Two times a day (BID) | ORAL | Status: DC

## 2020-01-09 MED ORDER — AMLODIPINE BESYLATE 5 MG PO TABS
10.0000 mg | ORAL_TABLET | Freq: Every day | ORAL | Status: DC
  Administered 2020-01-10: 10 mg via ORAL
  Filled 2020-01-09 (×2): qty 1
  Filled 2020-01-09: qty 2
  Filled 2020-01-09 (×2): qty 1

## 2020-01-09 MED ORDER — ALUM & MAG HYDROXIDE-SIMETH 200-200-20 MG/5ML PO SUSP: 30.0000 mL | ORAL | Status: DC | PRN

## 2020-01-09 MED ORDER — CHLORPROMAZINE HCL 25 MG PO TABS
25.0000 mg | ORAL_TABLET | Freq: Three times a day (TID) | ORAL | Status: DC
  Administered 2020-01-09 – 2020-01-10 (×3): 25 mg via ORAL
  Filled 2020-01-09 (×9): qty 1

## 2020-01-09 MED ORDER — ONDANSETRON HCL 4 MG PO TABS
4.0000 mg | ORAL_TABLET | Freq: Three times a day (TID) | ORAL | Status: DC | PRN
  Administered 2020-01-09: 4 mg via ORAL
  Filled 2020-01-09: qty 1

## 2020-01-09 MED ORDER — ACETAMINOPHEN 500 MG PO TABS: 1000.0000 mg | ORAL_TABLET | Freq: Four times a day (QID) | ORAL | 0 refills | Status: DC | PRN

## 2020-01-09 MED ORDER — CHLORHEXIDINE GLUCONATE 0.12 % MT SOLN: 15.0000 mL | Freq: Two times a day (BID) | OROMUCOSAL | 0 refills | Status: DC

## 2020-01-09 MED ORDER — VITAMIN D 25 MCG (1000 UNIT) PO TABS
1000.0000 [IU] | ORAL_TABLET | Freq: Every day | ORAL | Status: DC
  Administered 2020-01-09 – 2020-01-10 (×2): 1000 [IU] via ORAL
  Filled 2020-01-09 (×5): qty 1

## 2020-01-09 MED ORDER — CHLORPROMAZINE HCL 25 MG/ML IJ SOLN: 25.0000 mg | Freq: Three times a day (TID) | INTRAMUSCULAR | Status: DC | PRN

## 2020-01-09 MED ORDER — POLYETHYLENE GLYCOL 3350 17 G PO PACK
17.0000 g | PACK | Freq: Every day | ORAL | Status: DC
  Filled 2020-01-09 (×5): qty 1

## 2020-01-09 MED ORDER — OXYCODONE HCL 5 MG/5ML PO SOLN: 5.0000 mg | ORAL | 0 refills | Status: DC | PRN

## 2020-01-09 MED ORDER — VALPROATE SODIUM 250 MG/5ML PO SOLN
500.0000 mg | Freq: Two times a day (BID) | ORAL | Status: DC
  Administered 2020-01-09 – 2020-01-10 (×2): 500 mg via ORAL
  Filled 2020-01-09 (×6): qty 10

## 2020-01-09 MED ORDER — DIPHENHYDRAMINE HCL 25 MG PO CAPS: 25.0000 mg | ORAL_CAPSULE | Freq: Every evening | ORAL | 0 refills | Status: DC | PRN

## 2020-01-09 MED ORDER — BENZTROPINE MESYLATE 0.5 MG PO TABS
0.5000 mg | ORAL_TABLET | Freq: Two times a day (BID) | ORAL | Status: DC
  Administered 2020-01-09 – 2020-01-10 (×2): 0.5 mg via ORAL
  Filled 2020-01-09 (×5): qty 1

## 2020-01-09 MED ORDER — ORAL CARE MOUTH RINSE: 15.0000 mL | Freq: Two times a day (BID) | OROMUCOSAL | 0 refills | Status: DC

## 2020-01-09 MED ORDER — CLONAZEPAM 0.5 MG PO TBDP: 0.5000 mg | ORAL_TABLET | Freq: Two times a day (BID) | ORAL | 0 refills | Status: DC

## 2020-01-09 MED ORDER — LORAZEPAM 1 MG PO TABS: 1.0000 mg | ORAL_TABLET | ORAL | Status: DC | PRN

## 2020-01-09 MED ORDER — HYDROCHLOROTHIAZIDE 50 MG PO TABS: 50.0000 mg | ORAL_TABLET | Freq: Every day | ORAL | Status: DC

## 2020-01-09 MED ORDER — VALPROIC ACID 250 MG/5ML PO SOLN
500.0000 mg | Freq: Two times a day (BID) | ORAL | Status: DC
  Filled 2020-01-09 (×3): qty 10

## 2020-01-09 MED ORDER — ONDANSETRON HCL 4 MG PO TABS: 4.0000 mg | ORAL_TABLET | ORAL | 0 refills | Status: DC | PRN

## 2020-01-09 MED ORDER — POLYETHYLENE GLYCOL 3350 17 G PO PACK: 17.0000 g | PACK | Freq: Two times a day (BID) | ORAL | 0 refills | Status: DC

## 2020-01-09 MED ORDER — BACITRACIN ZINC 500 UNIT/GM EX OINT: TOPICAL_OINTMENT | Freq: Two times a day (BID) | CUTANEOUS | 0 refills | Status: DC

## 2020-01-09 MED ORDER — CLONAZEPAM 0.5 MG PO TABS
0.5000 mg | ORAL_TABLET | Freq: Two times a day (BID) | ORAL | Status: DC
  Administered 2020-01-09 – 2020-01-10 (×2): 0.5 mg via ORAL
  Filled 2020-01-09 (×2): qty 1

## 2020-01-09 MED ORDER — CHLORHEXIDINE GLUCONATE 0.12 % MT SOLN
15.0000 mL | Freq: Two times a day (BID) | OROMUCOSAL | Status: DC
  Administered 2020-01-09 – 2020-01-10 (×2): 15 mL via OROMUCOSAL
  Filled 2020-01-09 (×7): qty 15

## 2020-01-09 MED ORDER — DOCUSATE SODIUM 100 MG PO CAPS
100.0000 mg | ORAL_CAPSULE | Freq: Two times a day (BID) | ORAL | Status: DC
  Administered 2020-01-09 – 2020-01-10 (×2): 100 mg via ORAL
  Filled 2020-01-09 (×7): qty 1

## 2020-01-09 MED ORDER — DIPHENHYDRAMINE HCL 25 MG PO CAPS
25.0000 mg | ORAL_CAPSULE | Freq: Every evening | ORAL | Status: DC | PRN
  Administered 2020-01-09: 25 mg via ORAL
  Filled 2020-01-09: qty 1

## 2020-01-09 MED ORDER — LORAZEPAM 2 MG/ML IJ SOLN: 2.0000 mg | Freq: Four times a day (QID) | INTRAMUSCULAR | 0 refills | Status: DC | PRN

## 2020-01-09 MED ORDER — LORAZEPAM 1 MG PO TABS
1.0000 mg | ORAL_TABLET | Freq: Four times a day (QID) | ORAL | Status: DC | PRN
  Administered 2020-01-09: 1 mg via ORAL
  Filled 2020-01-09: qty 1

## 2020-01-09 MED ORDER — CHLORPROMAZINE HCL 25 MG/ML IJ SOLN
50.0000 mg | Freq: Three times a day (TID) | INTRAMUSCULAR | Status: DC
  Filled 2020-01-09 (×3): qty 2

## 2020-01-09 NOTE — Progress Notes (Signed)
Report called to [facility], Carver Fila. All questions answered, and no further concerns for patient report.  Was stated that patient will have to have a room change for admission to [facility] d/t elopement precautions, and was requested that patient have a current COVID-19 test prior to transport.   [employer] contacted for patient transport by [facility] for patient transport to [facility].  Patient belongings collected and bagged for patient.
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
## 2020-01-09 NOTE — Tx Team (Cosign Needed)
Initial Treatment Plan 01/09/2020 7:24 PM Paul Bender VHO:[id number]    PATIENT STRESSORS: Health problems Traumatic event   PATIENT STRENGTHS: Ability for insight Average or above average intelligence Motivation for treatment/growth Physical Health Supportive family/friends   PATIENT IDENTIFIED PROBLEMS: anxiety  depression  Suicide attempt                 DISCHARGE CRITERIA:  Ability to meet basic life and health needs Improved stabilization in mood, thinking, and/or behavior Motivation to continue treatment in a less acute level of care Need for constant or close observation no longer present  PRELIMINARY DISCHARGE PLAN: Outpatient therapy Participate in family therapy Return to previous living arrangement  PATIENT/FAMILY INVOLVEMENT: This treatment plan has been presented to and reviewed with the patient, Paul Bender.  The patient and family have been given the opportunity to ask questions and make suggestions.  Raylene Miyamoto, RN 01/09/2020, 7:24 PM

## 2020-01-09 NOTE — Consult Note (Signed)
  Practitioner received new orders from Dr. Lucianne Muss to discontinue Haldol as it has remained ineffective during this hospitalization.  New orders received to start Thorazine 100 mg p.o. 3 times daily or Thorazine 50 mg IM 3 times daily as needed for agitation.  New orders received to increase Depakote to 500 mg p.o. twice daily.  Most recent Depakote level of 22 was drawn June 4.  New order placed to obtain Depakote level in 4 days.  Also reviewed patient's most recent EKG as of 617, QTC at 442.  Patient has standing orders currently for Ativan 2 mg p.o. or IM, will continue to recommend ongoing administration of this medication for agitation as well.  Disposition team is working closely with behavioral health Hospital to find patient appropriate placement for ongoing suicidal ideation, depression, agitation, and aggressive behaviors.
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
## 2020-01-09 NOTE — TOC Progression Note (Signed)
Transition of Care [facility]) - Progression Note    Patient Details  Name: Paul Bender MRN: [id number] Date of Birth: 08-Jun-1991  Transition of Care [facility]) CM/SW Contact  Astrid Drafts Berna Spare, RN Phone Number: 01/09/2020, 11:58 AM  Clinical Narrative:   Pt accepted for admission to [facility], per director 4N/4NP, Etta Grandchild.  Left message for Wells Guiles, CSW with [facility] intake, requesting information on timing of bed availability.  TOC Case Manager will follow with updates and arrange transportation via non-emergent [employer] when bed available.  Dr. Bedelia Person is aware of pending transfer.      Expected Discharge Plan: Psychiatric Hospital Barriers to Discharge: Continued Medical Work up  Expected Discharge Plan and Services Expected Discharge Plan: Psychiatric Hospital   Discharge Planning Services: CM Consult   Living arrangements for the past 2 months: Single Family Home                                       Social Determinants of Health (SDOH) Interventions    Readmission Risk Interventions No flowsheet data found.  Quintella Baton, RN, BSN  Trauma/Neuro ICU Case Manager [phone]

## 2020-01-09 NOTE — TOC Progression Note (Signed)
Transition of Care [facility]) - Progression Note    Patient Details  Name: EDILSON VITAL MRN: [id number] Date of Birth: [birth year]  Transition of Care [facility]) CM/SW Contact  Glennon Mac, RN Phone Number: 01/09/2020, 10:03 AM  Clinical Narrative:  Noted events from last evening and early this AM.  Dr. Bedelia Person has spoken with Dr. Lucianne Muss from [facility], in an attempt to expedite inpatient psychiatric admission to facility.  Spoke with [facility] Advanced Care Supervisor, Macario Golds, who is also reaching out to [facility] for assistance.  Will follow with updates as they are available.      Expected Discharge Plan: Psychiatric Hospital Barriers to Discharge: Continued Medical Work up  Expected Discharge Plan and Services Expected Discharge Plan: Psychiatric Hospital   Discharge Planning Services: CM Consult   Living arrangements for the past 2 months: Single Family Home                                       Social Determinants of Health (SDOH) Interventions    Readmission Risk Interventions No flowsheet data found.  Quintella Baton, RN, BSN  Trauma/Neuro ICU Case Manager [phone]

## 2020-01-09 NOTE — Progress Notes (Signed)
Patients belongings removed and secured in belongings bag. Patient provided burgundy scrubs and new patient socks. Plan of care reviewed with patient by PMHNP from [facility]. Patient is agreeable to plan of care at this time. Resting in bed.   AC pulled patient 1:1 sitter and hospital security is sitting at bedside at this time.

## 2020-01-09 NOTE — TOC Transition Note (Addendum)
Transition of Care [facility]) - CM/SW Discharge Note   Patient Details  Name: Paul Bender MRN: [id number] Date of Birth: [birth year]  Transition of Care [facility]) CM/SW Contact:  Glennon Mac, RN Phone Number: 01/09/2020, 2:00 PM   Clinical Narrative:   Notified by Royal Hawthorn with [facility] that patient has a bed available and ready at their facility.  Patient going to Room 302-1; nurse to call report to [phone].    1:46pm  Addendum:  [employer] notified for transport.    Barriers to Discharge: Continued Medical Work up          Discharge Placement  [facility]                     Discharge Plan and Services   Discharge Planning Services: CM Consult                                 Social Determinants of Health (SDOH) Interventions     Readmission Risk Interventions No flowsheet data found.  Quintella Baton, RN, BSN  Trauma/Neuro ICU Case Manager [phone]

## 2020-01-09 NOTE — Progress Notes (Signed)
Pt was up stated he vomited, and there was evidence in room. Pt was given PRN Zofran per [facility] for N/V and pt given PRN Tylenol for R-elbow pain 10/10. Pt was educated on process and things moving forward. Pt encouraged to talk to the doctor tomorrow.
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
## 2020-01-09 NOTE — Progress Notes (Signed)
Pt seen on the unit minimally earlier, pt avoidant with writer this evening    01/09/20 2100  Psych Admission Type (Psych Patients Only)  Admission Status Involuntary  Psychosocial Assessment  Patient Complaints Anxiety  Eye Contact Brief  Facial Expression Anxious;Masked;Pained;Worried  Affect Anxious;Depressed;Sad;Sullen  Surveyor, minerals Activity Slow  Appearance/Hygiene In scrubs  Behavior Characteristics Cooperative  Mood Depressed  Thought Process  Coherency WDL  Content Blaming self  Delusions None reported or observed  Perception WDL  Hallucination None reported or observed  Judgment Poor  Confusion Mild  Danger to Self  Current suicidal ideation? Denies  Danger to Others  Danger to Others None reported or observed

## 2020-01-09 NOTE — Consult Note (Signed)
Patient seen and evaluated in person by this provider.  He was upset prior to seeing him as he was anxious and wanted to leave.  He is frustrated with being here for so long and is anxious to see his children.  Explained the plan again to the client that he would transfer from the medical floor to psychiatry to stabilize prior to going home.  Assured the client that the psychiatric medical: Provider is working on getting him a room today at the behavioral health hospital for him to transfer.  He was agreeable with the plan and calm and cooperative.  Evidently he has now acquired room and will transfer via police per supervising, Isabelle Course.  Nanine Means, [facility] NP

## 2020-01-10 ENCOUNTER — Inpatient Hospital Stay (HOSPITAL_COMMUNITY): Payer: No Typology Code available for payment source

## 2020-01-10 ENCOUNTER — Encounter (HOSPITAL_COMMUNITY): Payer: Self-pay | Admitting: Psychiatry

## 2020-01-10 ENCOUNTER — Inpatient Hospital Stay (HOSPITAL_COMMUNITY)
Admission: EM | Admit: 2020-01-10 | Discharge: 2020-01-13 | DRG: 603 | Discharge status: Against Medical Advice | Payer: No Typology Code available for payment source | Source: Ambulatory Visit | Attending: Internal Medicine | Admitting: Internal Medicine

## 2020-01-10 DIAGNOSIS — D649 Anemia, unspecified: Secondary | ICD-10-CM

## 2020-01-10 DIAGNOSIS — S82202F Unspecified fracture of shaft of left tibia, subsequent encounter for open fracture type IIIA, IIIB, or IIIC with routine healing: Secondary | ICD-10-CM | POA: Diagnosis not present

## 2020-01-10 DIAGNOSIS — R7401 Elevation of levels of liver transaminase levels: Secondary | ICD-10-CM

## 2020-01-10 DIAGNOSIS — R945 Abnormal results of liver function studies: Secondary | ICD-10-CM | POA: Diagnosis present

## 2020-01-10 DIAGNOSIS — S32401D Unspecified fracture of right acetabulum, subsequent encounter for fracture with routine healing: Secondary | ICD-10-CM | POA: Diagnosis not present

## 2020-01-10 DIAGNOSIS — R000 Tachycardia, unspecified: Secondary | ICD-10-CM | POA: Diagnosis present

## 2020-01-10 DIAGNOSIS — X810XXD Intentional self-harm by jumping or lying in front of motor vehicle, subsequent encounter: Secondary | ICD-10-CM

## 2020-01-10 DIAGNOSIS — S32401S Unspecified fracture of right acetabulum, sequela: Secondary | ICD-10-CM

## 2020-01-10 DIAGNOSIS — E871 Hypo-osmolality and hyponatremia: Secondary | ICD-10-CM | POA: Diagnosis present

## 2020-01-10 DIAGNOSIS — R739 Hyperglycemia, unspecified: Secondary | ICD-10-CM | POA: Diagnosis present

## 2020-01-10 DIAGNOSIS — S82262C Displaced segmental fracture of shaft of left tibia, initial encounter for open fracture type IIIA, IIIB, or IIIC: Secondary | ICD-10-CM | POA: Diagnosis present

## 2020-01-10 DIAGNOSIS — Z79899 Other long term (current) drug therapy: Secondary | ICD-10-CM

## 2020-01-10 DIAGNOSIS — S62202D Unspecified fracture of first metacarpal bone, left hand, subsequent encounter for fracture with routine healing: Secondary | ICD-10-CM | POA: Diagnosis not present

## 2020-01-10 DIAGNOSIS — S32401A Unspecified fracture of right acetabulum, initial encounter for closed fracture: Secondary | ICD-10-CM | POA: Diagnosis present

## 2020-01-10 DIAGNOSIS — L03116 Cellulitis of left lower limb: Secondary | ICD-10-CM | POA: Diagnosis present

## 2020-01-10 DIAGNOSIS — E878 Other disorders of electrolyte and fluid balance, not elsewhere classified: Secondary | ICD-10-CM | POA: Diagnosis present

## 2020-01-10 DIAGNOSIS — F39 Unspecified mood [affective] disorder: Secondary | ICD-10-CM | POA: Diagnosis present

## 2020-01-10 DIAGNOSIS — S62319S Displaced fracture of base of unspecified metacarpal bone, sequela: Secondary | ICD-10-CM

## 2020-01-10 DIAGNOSIS — Z5329 Procedure and treatment not carried out because of patient's decision for other reasons: Secondary | ICD-10-CM | POA: Diagnosis present

## 2020-01-10 DIAGNOSIS — J029 Acute pharyngitis, unspecified: Secondary | ICD-10-CM | POA: Diagnosis present

## 2020-01-10 DIAGNOSIS — S82262S Displaced segmental fracture of shaft of left tibia, sequela: Secondary | ICD-10-CM

## 2020-01-10 DIAGNOSIS — R45851 Suicidal ideations: Secondary | ICD-10-CM

## 2020-01-10 DIAGNOSIS — L0390 Cellulitis, unspecified: Secondary | ICD-10-CM | POA: Diagnosis present

## 2020-01-10 LAB — COMPREHENSIVE METABOLIC PANEL
ALT: 75 U/L — ABNORMAL HIGH (ref 0–44)
AST: 47 U/L — ABNORMAL HIGH (ref 15–41)
Albumin: 3.5 g/dL (ref 3.5–5.0)
Alkaline Phosphatase: 185 U/L — ABNORMAL HIGH (ref 38–126)
Anion gap: 13 (ref 5–15)
BUN: 20 mg/dL (ref 6–20)
CO2: 27 mmol/L (ref 22–32)
Calcium: 8.4 mg/dL — ABNORMAL LOW (ref 8.9–10.3)
Chloride: 88 mmol/L — ABNORMAL LOW (ref 98–111)
Creatinine, Ser: 1.03 mg/dL (ref 0.61–1.24)
GFR calc Af Amer: >60 mL/min (ref >60)
GFR calc non Af Amer: >60 mL/min (ref >60)
Glucose, Bld: 121 mg/dL — ABNORMAL HIGH (ref 70–99)
Potassium: 3.2 mmol/L — ABNORMAL LOW (ref 3.5–5.1)
Sodium: 128 mmol/L — ABNORMAL LOW (ref 135–145)
Total Bilirubin: 0.6 mg/dL (ref 0.3–1.2)
Total Protein: 7.1 g/dL (ref 6.5–8.1)

## 2020-01-10 LAB — CBC WITH DIFFERENTIAL/PLATELET
Abs Immature Granulocytes: 0.14 10*3/uL — ABNORMAL HIGH (ref 0.00–0.07)
Basophils Absolute: 0 10*3/uL (ref 0.0–0.1)
Basophils Relative: 0 %
Eosinophils Absolute: 0 10*3/uL (ref 0.0–0.5)
Eosinophils Relative: 0 %
HCT: 27.2 % — ABNORMAL LOW (ref 39.0–52.0)
Hemoglobin: 9.2 g/dL — ABNORMAL LOW (ref 13.0–17.0)
Immature Granulocytes: 1 %
Lymphocytes Relative: 8 %
Lymphs Abs: 1.1 10*3/uL (ref 0.7–4.0)
MCH: 29.8 pg (ref 26.0–34.0)
MCHC: 33.8 g/dL (ref 30.0–36.0)
MCV: 88 fL (ref 80.0–100.0)
Monocytes Absolute: 1.6 10*3/uL — ABNORMAL HIGH (ref 0.1–1.0)
Monocytes Relative: 12 %
Neutro Abs: 10.4 10*3/uL — ABNORMAL HIGH (ref 1.7–7.7)
Neutrophils Relative %: 79 %
Platelets: 209 10*3/uL (ref 150–400)
RBC: 3.09 MIL/uL — ABNORMAL LOW (ref 4.22–5.81)
RDW: 15.2 % (ref 11.5–15.5)
WBC: 13.3 10*3/uL — ABNORMAL HIGH (ref 4.0–10.5)
nRBC: 0 % (ref 0.0–0.2)

## 2020-01-10 LAB — LACTIC ACID, PLASMA: Lactic Acid, Venous: 1.5 mmol/L (ref 0.5–1.9)

## 2020-01-10 LAB — PROTIME-INR
INR: 1.2 (ref 0.8–1.2)
Prothrombin Time: 14.5 seconds (ref 11.4–15.2)

## 2020-01-10 LAB — APTT: aPTT: 35 seconds (ref 24–36)

## 2020-01-10 MED ORDER — LACTATED RINGERS IV BOLUS (SEPSIS): 250.0000 mL | Freq: Once | INTRAVENOUS | Status: DC

## 2020-01-10 MED ORDER — ENOXAPARIN SODIUM 40 MG/0.4ML ~~LOC~~ SOLN
40.0000 mg | Freq: Every day | SUBCUTANEOUS | Status: DC
  Administered 2020-01-11: 40 mg via SUBCUTANEOUS

## 2020-01-10 MED ORDER — LACTATED RINGERS IV BOLUS (SEPSIS)
1000.0000 mL | Freq: Once | INTRAVENOUS | Status: AC
  Administered 2020-01-10: 1000 mL via INTRAVENOUS

## 2020-01-10 MED ORDER — IBUPROFEN 800 MG PO TABS
800.0000 mg | ORAL_TABLET | Freq: Once | ORAL | Status: AC
  Administered 2020-01-10: 800 mg via ORAL
  Filled 2020-01-10: qty 1

## 2020-01-10 MED ORDER — MENTHOL 3 MG MT LOZG
1.0000 | LOZENGE | OROMUCOSAL | Status: DC | PRN
  Filled 2020-01-10: qty 9

## 2020-01-10 MED ORDER — SODIUM CHLORIDE 0.9 % IV SOLN
1.0000 g | INTRAVENOUS | Status: DC
  Administered 2020-01-11 – 2020-01-12 (×2): 1 g via INTRAVENOUS
  Filled 2020-01-10 (×2): qty 1

## 2020-01-10 MED ORDER — IOHEXOL 300 MG/ML  SOLN
100.0000 mL | Freq: Once | INTRAMUSCULAR | Status: AC | PRN
  Administered 2020-01-10: 100 mL via INTRAVENOUS

## 2020-01-10 MED ORDER — SODIUM CHLORIDE (PF) 0.9 % IJ SOLN
INTRAMUSCULAR | Status: AC
  Filled 2020-01-10: qty 50

## 2020-01-10 MED ORDER — CLONAZEPAM 0.125 MG PO TBDP
0.2500 mg | ORAL_TABLET | Freq: Two times a day (BID) | ORAL | Status: DC
  Administered 2020-01-10: 0.25 mg via ORAL
  Filled 2020-01-10: qty 2

## 2020-01-10 MED ORDER — PHENOL 1.4 % MT LIQD
1.0000 | OROMUCOSAL | Status: DC | PRN
  Administered 2020-01-10: 1 via OROMUCOSAL
  Filled 2020-01-10: qty 177

## 2020-01-10 MED ORDER — BENZTROPINE MESYLATE 0.5 MG PO TABS: 0.5000 mg | ORAL_TABLET | Freq: Two times a day (BID) | ORAL | Status: DC | PRN

## 2020-01-10 MED ORDER — PHENOL 1.4 % MT LIQD: 1.0000 | OROMUCOSAL | Status: DC | PRN

## 2020-01-10 MED ORDER — SODIUM CHLORIDE 0.9 % IV SOLN
2.0000 g | INTRAVENOUS | Status: DC
  Administered 2020-01-10: 2 g via INTRAVENOUS
  Filled 2020-01-10: qty 20

## 2020-01-10 NOTE — H&P (Signed)
History and Physical    Paul Bender ZOX:[id number] DOB: 06-27-1991 DOA: 01/10/2020  PCP: Patient, No Pcp Per  Patient coming from: Behavior health  I have personally briefly reviewed patient's old medical records in [facility]  Chief Complaint: Fever  HPI: Paul Bender is a 29 y.o. male with medical history significant for recent suicide attempt admitted by psychiatry who presents from behavior health for concerns of fever.    Patient was recently admitted to [facility] on 5/12 as a level 1 trauma following a suicide attempt where he stepped in front of a tractor trailer and suffered numerous fractures requiring repair by ortho. He also briefly required intubation for acute hypoxia for 2 days. Psychiatrist was consulted and he had continued aggressive behavior requiring medication intervention and was ultimately transferred to psychiatric facility on 6/18.   Patient reports that yesterday he began to have a sore throat worse with swallowing. Denies seeing any white patches on tongue. No cough or runny nose.  No chest pain or shortness of breath. No headache. Complains of left ankle/distal LE pain. He has been walking on it for a few days with his CAM boot and noticed that it has been more swollen and painful. Denies any fever although he was noted to have one on 6/17 and had negative CXR and negative bilateral lower extremity doppler ultrasound. No nausea/vomiting/diarrhea. No abdominal pain. No pain to his other incisions from recent surgical sites.   He had leukocytosis of 13.3. Stable hgb of 9.2. Na of 128, K of 3.2. Glucose of 121. AST of 47, ALT of 75, and alkaline phosphatase of 185 which is stable from prior.   CT of the left LE shows only soft tissue swelling but no osteomyelitis or changes to recent surgical site.  Review of Systems:  Constitutional: No Weight Change, + Fever ENT/Mouth: + sore throat, No Rhinorrhea Eyes: No Eye Pain, No Vision  Changes Cardiovascular: No Chest Pain, no SOB Respiratory: No Cough, No Sputum, No Wheezing, no Dyspnea  Gastrointestinal: No Nausea, No Vomiting, No Diarrhea, No Constipation, No Pain Genitourinary: no Urinary Incontinence, No Urgency, No Flank Pain Musculoskeletal: No Arthralgias, No Myalgias Skin: No Skin Lesions, No Pruritus, Neuro: no Weakness, No Numbness Psych: No Anxiety/Panic, No Depression, no decrease appetite Heme/Lymph: No Bruising, No Bleeding   Recent Procedures Dr. Grace Isaac, 12/03/19             Pelvic angiogram with embolization    Dr. Annalee Genta, 12/03/19             1. Mandibulo-Maxillary Fixation              2.  Debridement and closure of complex scalp laceration   Dr. Jena Gauss, 12/03/19             Intramedullary nailing of left segmental tibia fracture             Irrigation and debridement of left type IIIB tibia fracture             Placement of antibiotic cement spacer to left tibia             Incisional wound vac placement to left leg             Placement of right distal femoral traction pin   Dr. Jena Gauss, 12/04/19             Open reduction internal fixation of right acetabular fracture  Removal of traction pin right distal femur             Stress exam of right posterior wall             Dressing change under anesthesia left leg   Dr. Jenne Pane, 12/20/19             Replacement of maxillomandibular fixation wire.   Dr. Annalee Genta, 12/26/19             ORIF of mandible fx   Dr. Melvyn Novas, 01/05/20             #1: Open treatment of left thumb carpometacarpal dislocation with             internal fixation             #2: Open treatment of left thumb metacarpal malunion requiring     internal fixation             #3: Radiographs 3 views left thumb     Past Surgical History:  Procedure Laterality Date  . CLOSED REDUCTION MANDIBLE N/A 12/20/2019   Procedure: Replacement of Mandibular-Maxillary Fixation;  Surgeon: Christia Reading, MD;  Location: [facility] OR;   Service: ENT;  Laterality: N/A;  . I & D EXTREMITY Left 12/03/2019   Procedure: IRRIGATION AND DEBRIDEMENT EXTREMITY;  Surgeon: Roby Lofts, MD;  Location: [facility] OR;  Service: Orthopedics;  Laterality: Left;  . INSERTION OF TRACTION PIN Right 12/03/2019   Procedure: INSERTION OF TRACTION PIN;  Surgeon: Roby Lofts, MD;  Location: [facility] OR;  Service: Orthopedics;  Laterality: Right;  . IR ANGIOGRAM PELVIS SELECTIVE OR SUPRASELECTIVE  12/03/2019  . IR ANGIOGRAM SELECTIVE EACH ADDITIONAL VESSEL  12/03/2019  . IR EMBO ART  VEN HEMORR LYMPH EXTRAV  INC GUIDE ROADMAPPING  12/03/2019  . IR US GUIDE VASC ACCESS LEFT  12/03/2019  . ORIF ACETABULAR FRACTURE Right 12/04/2019   Procedure: OPEN REDUCTION INTERNAL FIXATION (ORIF) ACETABULAR FRACTURE W/ DRESSING CHANGE LEFT LEG;  Surgeon: Roby Lofts, MD;  Location: [facility] OR;  Service: Orthopedics;  Laterality: Right;  . ORIF MANDIBULAR FRACTURE N/A 12/03/2019   Procedure: MANDIBULAR-MAXILLARY FIXATION;  Surgeon: Roby Lofts, MD;  Location: [facility] OR;  Service: Orthopedics;  Laterality: N/A;  . ORIF MANDIBULAR FRACTURE N/A 12/26/2019   Procedure: OPEN REDUCTION INTERNAL FIXATION (ORIF) MANDIBULAR FRACTURE;  Surgeon: Osborn Coho, MD;  Location: [facility] OR;  Service: ENT;  Laterality: N/A;  . PERCUTANEOUS PINNING Left 01/05/2020   Procedure: OPEN REDUCTION INTERNAL FIXATION (ORIF) FINGER THUMB;  Surgeon: Bradly Bienenstock, MD;  Location: [facility] OR;  Service: Orthopedics;  Laterality: Left;  . RADIOLOGY WITH ANESTHESIA N/A 12/03/2019   Procedure: IR WITH ANESTHESIA;  Surgeon: Radiologist, Medication, MD;  Location: [facility] OR;  Service: Radiology;  Laterality: N/A;  . SCALP LACERATION REPAIR Right 12/03/2019   Procedure: CLOSURE SCALP LACERATION;  Surgeon: Roby Lofts, MD;  Location: [facility] OR;  Service: Orthopedics;  Laterality: Right;  . TIBIA IM NAIL INSERTION Left 12/03/2019   Procedure: INTRAMEDULLARY (IM) NAIL TIBIAL;  Surgeon: Roby Lofts, MD;  Location: [facility] OR;  Service:  Orthopedics;  Laterality: Left;     reports that he has never smoked. He has never used smokeless tobacco. He reports previous alcohol use. He reports previous drug use.  No Known Allergies    Prior to Admission medications   Medication Sig Start Date End Date Taking? Authorizing Provider  acetaminophen (TYLENOL) 500 MG tablet Take 2 tablets (1,000 mg total) by mouth  every 6 (six) hours as needed. 01/09/20   Adam Phenix, PA-C  amLODipine (NORVASC) 10 MG tablet Take 1 tablet (10 mg total) by mouth daily. 01/10/20   Adam Phenix, PA-C  bacitracin ointment Apply topically 2 (two) times daily. 01/09/20   Adam Phenix, PA-C  benztropine (COGENTIN) 0.5 MG tablet Take 1 tablet (0.5 mg total) by mouth 2 (two) times daily. 01/09/20   Adam Phenix, PA-C  chlorhexidine (PERIDEX) 0.12 % solution 15 mLs by Mouth Rinse route 2 (two) times daily. 01/09/20   Adam Phenix, PA-C  chlorproMAZINE (THORAZINE) 25 MG/ML injection Inject 2 mLs (50 mg total) into the muscle 3 (three) times daily. 01/09/20   Adam Phenix, PA-C  cholecalciferol (VITAMIN D) 25 MCG tablet Take 2 tablets (2,000 Units total) by mouth daily. 01/10/20   Adam Phenix, PA-C  clonazePAM (KLONOPIN) 0.5 MG disintegrating tablet Take 1 tablet (0.5 mg total) by mouth 2 (two) times daily. 01/09/20   Adam Phenix, PA-C  diphenhydrAMINE (BENADRYL) 25 mg capsule Take 1 capsule (25 mg total) by mouth at bedtime as needed for sleep. 01/09/20   Adam Phenix, PA-C  docusate (COLACE) 50 MG/5ML liquid Take 10 mLs (100 mg total) by mouth 2 (two) times daily. 01/09/20   Adam Phenix, PA-C  enoxaparin (LOVENOX) 30 MG/0.3ML injection Inject 0.3 mLs (30 mg total) into the skin every 12 (twelve) hours. 01/09/20   Adam Phenix, PA-C  hydrochlorothiazide (HYDRODIURIL) 50 MG tablet Take 1 tablet (50 mg total) by mouth daily. 01/10/20   Adam Phenix, PA-C  LORazepam (ATIVAN) 2 MG tablet Take 1  tablet (2 mg total) by mouth every 6 (six) hours as needed for anxiety. 01/09/20   Adam Phenix, PA-C  LORazepam (ATIVAN) 2 MG/ML injection Inject 1 mL (2 mg total) into the muscle every 6 (six) hours as needed for anxiety. 01/09/20   Adam Phenix, PA-C  methocarbamol (ROBAXIN) 500 MG tablet Take 2 tablets (1,000 mg total) by mouth every 8 (eight) hours as needed for muscle spasms. 01/09/20   Adam Phenix, PA-C  Mouthwashes (MOUTH RINSE) LIQD solution 15 mLs by Mouth Rinse route 2 times daily at 12 noon and 4 pm. 01/09/20   Adam Phenix, PA-C  ondansetron (ZOFRAN) 4 MG tablet Take 1 tablet (4 mg total) by mouth every 4 (four) hours as needed for nausea. 01/09/20   Adam Phenix, PA-C  oxyCODONE (ROXICODONE) 5 MG/5ML solution Take 5-10 mLs (5-10 mg total) by mouth every 4 (four) hours as needed for moderate pain (5mg  for moderate pain, 10mg  for severe pain). 01/09/20   , PA-C  polyethylene glycol (MIRALAX / GLYCOLAX) 17 g packet Take 17 g by mouth 2 (two) times daily. 01/09/20   Adam Phenix, PA-C  valproic acid (DEPAKENE) 250 MG/5ML solution Take 10 mLs (500 mg total) by mouth 2 (two) times daily. 01/09/20   Adam Phenix, PA-C    Physical Exam: There were no vitals filed for this visit.  Constitutional: NAD, calm, comfortable Eyes: PERRL, lids and conjunctivae normal ENMT: Mucous membranes are moist. Posterior pharynx clear of any exudate or lesions.Normal dentition.  Neck: normal, supple Respiratory: clear to auscultation bilaterally, no wheezing, no crackles. Normal respiratory effort. No accessory muscle use.  Cardiovascular: Regular rate and rhythm, no murmurs / rubs / gallops.  Bilateral nonpitting lower extremity edema worse on the left.  2+ pedal pulses.  Abdomen: no tenderness, no masses  palpated.  Bowel sounds positive.  Musculoskeletal: no clubbing / cyanosis. No joint deformity upper and lower extremities.  Left lower extremity  edema up to high ankle with increased warmth to touch and increased pain with palpation.   Skin: Healed scar to right frontal scalp.  Nonerythematous clean suture wound on left first MTP.  Healing sutured wound to mid lower abdomen.  Various healing wounds to left lower extremity. Neurologic: CN 2-12 grossly intact. Sensation intact. Strength 5/5 in all 4.  Psychiatric: Normal judgment and insight. Alert and oriented x 3.  Flat affect.   Labs on Admission: I have personally reviewed following labs and imaging studies  CBC: Recent Labs  Lab 01/07/20 0634 01/10/20 1728  WBC 7.2 13.3*  NEUTROABS  --  10.4*  HGB 10.5* 9.2*  HCT 32.1* 27.2*  MCV 89.9 88.0  PLT 259 209   Basic Metabolic Panel: Recent Labs  Lab 01/07/20 0634 01/10/20 1728  NA 135 128*  K 3.9 3.2*  CL 94* 88*  CO2 29 27  GLUCOSE 101* 121*  BUN 14 20  CREATININE 0.84 1.03  CALCIUM 9.4 8.4*   GFR: Estimated Creatinine Clearance: 103.8 mL/min (by C-G formula based on SCr of 1.03 mg/dL). Liver Function Tests: Recent Labs  Lab 01/07/20 0634 01/10/20 1728  AST 44* 47*  ALT 75* 75*  ALKPHOS 252* 185*  BILITOT 0.5 0.6  PROT 6.7 7.1  ALBUMIN 3.4* 3.5   No results for input(s): LIPASE, AMYLASE in the last 168 hours. No results for input(s): AMMONIA in the last 168 hours. Coagulation Profile: Recent Labs  Lab 01/10/20 1728  INR 1.2   Cardiac Enzymes: No results for input(s): CKTOTAL, CKMB, CKMBINDEX, TROPONINI in the last 168 hours. BNP (last 3 results) No results for input(s): PROBNP in the last 8760 hours. HbA1C: No results for input(s): HGBA1C in the last 72 hours. CBG: No results for input(s): GLUCAP in the last 168 hours. Lipid Profile: No results for input(s): CHOL, HDL, LDLCALC, TRIG, CHOLHDL, LDLDIRECT in the last 72 hours. Thyroid Function Tests: No results for input(s): TSH, T4TOTAL, FREET4, T3FREE, THYROIDAB in the last 72 hours. Anemia Panel: No results for input(s): VITAMINB12, FOLATE,  FERRITIN, TIBC, IRON, RETICCTPCT in the last 72 hours. Urine analysis:    Component Value Date/Time   COLORURINE STRAW (A) 01/08/2020 0923   APPEARANCEUR CLEAR 01/08/2020 0923   LABSPEC 1.008 01/08/2020 0923   PHURINE 6.0 01/08/2020 0923   GLUCOSEU NEGATIVE 01/08/2020 0923   HGBUR SMALL (A) 01/08/2020 0923   BILIRUBINUR NEGATIVE 01/08/2020 0923   KETONESUR NEGATIVE 01/08/2020 0923   PROTEINUR NEGATIVE 01/08/2020 0923   NITRITE NEGATIVE 01/08/2020 0923   LEUKOCYTESUR NEGATIVE 01/08/2020 0923    Radiological Exams on Admission: DG Chest 2 View  Result Date: 01/10/2020 CLINICAL DATA:  29 year old male with suspected sepsis. EXAM: CHEST - 2 VIEW COMPARISON:  Chest radiograph dated 01/08/2020. FINDINGS: The heart size and mediastinal contours are within normal limits. Both lungs are clear. The visualized skeletal structures are unremarkable. IMPRESSION: No active cardiopulmonary disease. Electronically Signed   By: Elgie CollardArash  Radparvar M.D.   On: 01/10/2020 18:35   CT TIBIA FIBULA LEFT W CONTRAST  Result Date: 01/10/2020 CLINICAL DATA:  Right ankle in calf pain and swelling. Fever. Previous tibial fixation EXAM: CT OF THE LOWER RIGHT EXTREMITY WITH CONTRAST TECHNIQUE: Multidetector CT imaging of the lower right extremity was performed according to the standard protocol following intravenous contrast administration. COMPARISON:  X-ray 12/03/2019, 12/18/2019 CONTRAST:  100mL OMNIPAQUE IOHEXOL 300  MG/ML  SOLN FINDINGS: Bones/Joint/Cartilage Redemonstration of comminuted proximal and distal tibial diaphyseal fractures status post ORIF with long intramedullary rod and proximal and distal interlocking screws. Bone graft material is noted at the mid to distal tibial diaphysis. There is periosteal new bone formation associated with the fracture sites, most prevalent at the proximal fracture location. Hardware appears intact. Osseous alignment is unchanged and remains near anatomic. No new fracture. No areas  of cortical destruction are identified. Comminuted, mildly displaced mid to distal fibular diaphyseal fracture with 5.0 cm butterfly fragment which is slightly posteriorly displaced. There is also periosteal new bone formation at the fibular fracture site. No change in alignment from previous radiographs. Small amount of periosteal new bone formation along the anteromedial margin of the fibular head, which may represent a healing nondisplaced fracture or mechanical irritation from proximal-most tibial screw (series 3, image 56). No areas of cortical destruction are identified. Alignment at the knee and ankle are maintained. Joint spaces are preserved. No tibiotalar joint effusion or ankle joint effusion. Ligaments Suboptimally assessed by CT. Muscles and Tendons No well-defined intramuscular fluid collection. The tendinous structures appear intact within the limitations of CT. Soft tissues There is circumferential soft tissue edema at the level of the distal lower leg and ankle. No well-defined fluid collection. No soft tissue gas. No deep soft tissue ulceration. IMPRESSION: 1. Circumferential soft tissue edema at the level of the distal lower leg and ankle. No well-defined fluid collection or soft tissue gas. 2. No evidence to suggest osteomyelitis. 3. Redemonstration of comminuted proximal and distal tibial diaphyseal fractures status post ORIF with long intramedullary rod and proximal and distal interlocking screws. Hardware appears intact. Osseous alignment is unchanged and remains near-anatomic. 4. Small amount of periosteal new bone formation along the anteromedial margin of the fibular head, which may represent a healing nondisplaced fracture or mechanical irritation from the proximal-most tibial screw. Electronically Signed   By: Duanne Guess D.O.   On: 01/10/2020 19:09      Assessment/Plan  Fever likely secondary to cellulitis of the left LE s/p recent left tibia fracture and intramedullary  nailing Continue IV Rocephin Had negative COVID test 6/18  Anemia Stable. Pt required 2 units pRBC during last admission from trauma- had Right pelvic hematoma w/ extravasation  Continue to monitor with daily CBC  Transaminitis This has been persistent but improving. Unclear if related to trauma. Continue to monitor   Recent R acetabular FX  Initial skeletal traction per Dr. Jena Gauss, S/P ORIF acetabulum 5/13 by Dr. Jena Gauss.  NWB until 6 weeks post op (01/15/20).      Recent L open tibia FX  S/P I&D, IMN by Dr. Jena Gauss 5/12. Needs to wear splint to LLE as he cannot actively dorsiflex ankle. NWB. WBAT for transfers only.  Recent Displaced fracture of 1st left metacarpal  Underwent internal fixation by Dr. Melvyn Novas on 01/05/20.  He was placed in a splint.  He should remain in this for 2 weeks and then follow up in the office  Suicide attempt/mood disorder/IVC admit Admitted from behavior health as IVC. Needs psych consult in the morning to continue to follow  Denies any Suicidal or homicidal ideations at this time.  Sitter at all times.  Patient had multiple outbursts during his recent hospitalist stay that required multiple psych evaluation and medication adjustments.    Low concerns for NMS since he does not have AMS, rigidity and have cellulitis as source of fever.  Continue clonazepam, Thorazine, Depakote and PRN ativan  for now for med list in behavior health. Needs formal psych consult in the AM  DVT prophylaxis:.Lovenox Code Status: Full Family Communication: Plan discussed with patient at bedside  disposition Plan: Home with at least 2 midnight stays  Consults called:  Admission status: inpatient  Status is: Inpatient  Remains inpatient appropriate because:IV treatments appropriate due to intensity of illness or inability to take PO   Dispo: The patient is from: Home              Anticipated d/c is to: TBD- behavior health              Anticipated d/c date is: 3 days               Patient currently is not medically stable to d/c.         Orene Desanctis DO [facility]   If 7PM-7AM, please contact night-coverage [email or web address]   01/10/2020, 10:47 PM
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
## 2020-01-10 NOTE — Progress Notes (Signed)
[facility] MD Progress Note  01/10/2020 10:27 AM Paul Bender  MRN:  [id number] Subjective:  Patient reports pain on L ankle and difficulty ambulating without boot. Denies SI. Reports he is hopeful for discharge soon. Denies medication side effects at this time. Objective : I have reviewed chart notes and have met with patient. 29 year old male, admitted on 5/13 to trauma center after he stepped in front of a vehicle in a suicidal attempt. Required ORIF on R  Acetabulum. During admission patient exhibited aggressive, agitated behaviors and was followed by psychiatry consultants, was transferred to [facility] on medical clearance . He denied past psychiatric admissions or treatment .  Today patient presents calm, vaguely anxious. He is focused on discharging soon, stating he misses seeing his child. He is vague regarding traumatic event/suicidal attempt as above, and reports limited memory of event, states " it was like a hallucination". He reports he had been using cannabis but denies other drug use and admission UDS was positive for cannabis/ BAL was negative. Currently denies medication side effects. Denies suicidal ideations and presents future oriented , hoping for discharge soon. No agitated or disruptive behaviors on unit . He reports walking is painful due to left tib/fib fracture, and states ambulation is more comfortable with boot .  With his express consent I spoke with his GF , Daizetta. She reports that she has talked to him on the phone and that he appears to be doing better and closer to his baseline. She reports that he has no clear past psychiatric history other than intermittent cannabis use, but does have a family history of Bipolar Disorder. She does report that on day prior to event he appeared different , acting strangely and that he had been brought home by police- possible substance intoxication was suspected at the time . EKG 6/17 -112 bpm, QTc 442.  Principal Problem: S/P suicide  attempt Diagnosis: Active Problems:   Suicidal ideation  Total Time spent with patient: 20 minutes  Past Psychiatric History:   Past Medical History: History reviewed. No pertinent past medical history.  Past Surgical History:  Procedure Laterality Date  . CLOSED REDUCTION MANDIBLE N/A 12/20/2019   Procedure: Replacement of Mandibular-Maxillary Fixation;  Surgeon: [name], MD;  Location: [place];  Service: ENT;  Laterality: N/A;  . I & D EXTREMITY Left 12/03/2019   Procedure: IRRIGATION AND DEBRIDEMENT EXTREMITY;  Surgeon: [name], MD;  Location: [place];  Service: Orthopedics;  Laterality: Left;  . INSERTION OF TRACTION PIN Right 12/03/2019   Procedure: INSERTION OF TRACTION PIN;  Surgeon: [name], MD;  Location: [place];  Service: Orthopedics;  Laterality: Right;  . IR ANGIOGRAM PELVIS SELECTIVE OR SUPRASELECTIVE  12/03/2019  . IR ANGIOGRAM SELECTIVE EACH ADDITIONAL VESSEL  12/03/2019  . IR EMBO ART  VEN HEMORR LYMPH EXTRAV  INC GUIDE ROADMAPPING  12/03/2019  . IR US GUIDE VASC ACCESS LEFT  12/03/2019  . ORIF ACETABULAR FRACTURE Right 12/04/2019   Procedure: OPEN REDUCTION INTERNAL FIXATION (ORIF) ACETABULAR FRACTURE W/ DRESSING CHANGE LEFT LEG;  Surgeon: [name], MD;  Location: [place];  Service: Orthopedics;  Laterality: Right;  . ORIF MANDIBULAR FRACTURE N/A 12/03/2019   Procedure: MANDIBULAR-MAXILLARY FIXATION;  Surgeon: [name], MD;  Location: [place];  Service: Orthopedics;  Laterality: N/A;  . ORIF MANDIBULAR FRACTURE N/A 12/26/2019   Procedure: OPEN REDUCTION INTERNAL FIXATION (ORIF) MANDIBULAR FRACTURE;  Surgeon: Jerrell Belfast, MD;  Location: [place];  Service: ENT;  Laterality: N/A;  .  PERCUTANEOUS PINNING Left 01/05/2020   Procedure: OPEN REDUCTION INTERNAL FIXATION (ORIF) FINGER THUMB;  Surgeon: Iran Planas, MD;  Location: [place];  Service: Orthopedics;  Laterality: Left;  . RADIOLOGY WITH ANESTHESIA N/A 12/03/2019   Procedure: IR WITH ANESTHESIA;   Surgeon: Radiologist, Medication, MD;  Location: [place];  Service: Radiology;  Laterality: N/A;  . SCALP LACERATION REPAIR Right 12/03/2019   Procedure: CLOSURE SCALP LACERATION;  Surgeon: [name], MD;  Location: [place];  Service: Orthopedics;  Laterality: Right;  . TIBIA IM NAIL INSERTION Left 12/03/2019   Procedure: INTRAMEDULLARY (IM) NAIL TIBIAL;  Surgeon: [name], MD;  Location: [place];  Service: Orthopedics;  Laterality: Left;   Family History: History reviewed. No pertinent family history. Family Psychiatric  History:  Social History:  Social History   Substance and Sexual Activity  Alcohol Use Not Currently     Social History   Substance and Sexual Activity  Drug Use Not Currently    Social History   Socioeconomic History  . Marital status: Single    Spouse name: Not on file  . Number of children: Not on file  . Years of education: Not on file  . Highest education level: Not on file  Occupational History  . 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 Smoking status: Never Smoker  . Smokeless tobacco: Never Used  Vaping Use  . Vaping Use: Never used  Substance and Sexual Activity  . Alcohol use: Not Currently  . Drug use: Not Currently  . Sexual activity: Yes  Other Topics Concern  . Not on file  Social History Narrative  . Not on file   Social Determinants of Health   Financial Resource Strain:   . Difficulty of Paying Living Expenses:   Food Insecurity:   . Worried About Charity fundraiser in the Last Year:   . Arboriculturist in the Last Year:   Transportation Needs:   . Film/video editor (Medical):   Marland Kitchen Lack of Transportation (Non-Medical):   Physical Activity:   . Days of Exercise per Week:   . Minutes of Exercise per Session:   Stress:   . Feeling of Stress :   Social Connections:   . Frequency of Communication with Friends and Family:   . Frequency of Social Gatherings with Friends and Family:   . Attends Religious Services:   . Active  Member of Clubs or Organizations:   . Attends Archivist Meetings:   Marland Kitchen Marital Status:    Additional Social History:   Sleep: Fair  Appetite:  Fair  Current Medications: Current Facility-Administered Medications  Medication Dose Route Frequency Provider Last Rate Last Admin  . acetaminophen (TYLENOL) tablet 650 mg  650 mg Oral Q6H PRN Sharma Covert, MD   650 mg at 01/09/20 2220  . alum & mag hydroxide-simeth (MAALOX/MYLANTA) 200-200-20 MG/5ML suspension 30 mL  30 mL Oral Q4H PRN Sharma Covert, MD      . amLODipine (NORVASC) tablet 10 mg  10 mg Oral Daily Sharma Covert, MD   10 mg at 01/10/20 0810  . benztropine (COGENTIN) tablet 0.5 mg  0.5 mg Oral BID Sharma Covert, MD   0.5 mg at 01/10/20 0810  . chlorhexidine (PERIDEX) 0.12 % solution 15 mL  15 mL Mouth/Throat BID Sharma Covert, MD   15 mL at 01/10/20 0811  . chlorproMAZINE (THORAZINE) injection 25 mg  25 mg Intramuscular TID PRN Sharma Covert, MD      .  chlorproMAZINE (THORAZINE) tablet 25 mg  25 mg Oral TID Sharma Covert, MD   25 mg at 01/10/20 0810  . clonazePAM (KLONOPIN) tablet 0.5 mg  0.5 mg Oral BID Sharma Covert, MD   0.5 mg at 01/10/20 0810  . diphenhydrAMINE (BENADRYL) capsule 25 mg  25 mg Oral QHS PRN Sharma Covert, MD   25 mg at 01/09/20 2220  . docusate sodium (COLACE) capsule 100 mg  100 mg Oral BID Sharma Covert, MD   100 mg at 01/10/20 0810  . hydrOXYzine (ATARAX/VISTARIL) tablet 25 mg  25 mg Oral TID PRN Sharma Covert, MD   25 mg at 01/09/20 2338  . LORazepam (ATIVAN) tablet 1 mg  1 mg Oral Q6H PRN Sharma Covert, MD   1 mg at 01/09/20 2220  . magnesium hydroxide (MILK OF MAGNESIA) suspension 30 mL  30 mL Oral Daily PRN Sharma Covert, MD      . neomycin-bacitracin-polymyxin (NEOSPORIN) ointment   Topical BID Sharma Covert, MD   Given at 01/10/20 [phone]  . OLANZapine zydis (ZYPREXA) disintegrating tablet 10 mg  10 mg Oral Q8H PRN Sharma Covert, MD       And  . ziprasidone (GEODON) injection 20 mg  20 mg Intramuscular PRN Sharma Covert, MD      . ondansetron [facility]) tablet 4 mg  4 mg Oral Q8H PRN Sharma Covert, MD   4 mg at 01/09/20 2220  . oxyCODONE-acetaminophen (PERCOCET/ROXICET) 5-325 MG per tablet 1-2 tablet  1-2 tablet Oral Q6H PRN Sharma Covert, MD      . polyethylene glycol (MIRALAX / GLYCOLAX) packet 17 g  17 g Oral Daily Sharma Covert, MD      . traZODone (DESYREL) tablet 50 mg  50 mg Oral QHS PRN Sharma Covert, MD   50 mg at 01/09/20 2338  . Valproate Sodium (DEPAKENE) solution 500 mg  500 mg Oral BID Minda Ditto, RPH   500 mg at 01/10/20 5093  . Vitamin D3 (Vitamin D) tablet 1,000 Units  1,000 Units Oral Daily Sharma Covert, MD   1,000 Units at 01/10/20 2671    Lab Results:  Results for orders placed or performed during the hospital encounter of 12/03/19 (from the past 48 hour(s))  SARS Coronavirus 2 by RT PCR (hospital order, performed in [facility] hospital lab) Nasopharyngeal Nasopharyngeal Swab     Status: None   Collection Time: 01/09/20  2:06 PM   Specimen: Nasopharyngeal Swab  Result Value Ref Range   SARS Coronavirus 2 NEGATIVE NEGATIVE    Comment: (NOTE) SARS-CoV-2 target nucleic acids are NOT DETECTED.  The SARS-CoV-2 RNA is generally detectable in upper and lower respiratory specimens during the acute phase of infection. The lowest concentration of SARS-CoV-2 viral copies this assay can detect is 250 copies / mL. A negative result does not preclude SARS-CoV-2 infection and should not be used as the sole basis for treatment or other patient management decisions.  A negative result may occur with improper specimen collection / handling, submission of specimen other than nasopharyngeal swab, presence of viral mutation(s) within the areas targeted by this assay, and inadequate number of viral copies (<250 copies / mL). A negative result must be combined with  clinical observations, patient history, and epidemiological information.  Fact Sheet for Patients:   StrictlyIdeas.no  Fact Sheet for Healthcare Providers: BankingDealers.co.za  This test is not yet approved or  cleared by the [place]  States FDA and has been authorized for detection and/or diagnosis of SARS-CoV-2 by FDA under an Emergency Use Authorization (EUA).  This EUA will remain in effect (meaning this test can be used) for the duration of the COVID-19 declaration under Section 564(b)(1) of the Act, 21 U.S.C. section 360bbb-3(b)(1), unless the authorization is terminated or revoked sooner.  Performed at [facility], [place] [address], [place], [place] [address]     Blood Alcohol level:  Lab Results  Component Value Date   ETH <10 37/90/2409    Metabolic Disorder Labs: No results found for: HGBA1C, MPG No results found for: PROLACTIN Lab Results  Component Value Date   TRIG 110 12/06/2019    Physical Findings: AIMS: Facial and Oral Movements Muscles of Facial Expression: None, normal Lips and Perioral Area: None, normal Jaw: None, normal Tongue: None, normal,Extremity Movements Upper (arms, wrists, hands, fingers): None, normal Lower (legs, knees, ankles, toes): None, normal, Trunk Movements Neck, shoulders, hips: None, normal, Overall Severity Severity of abnormal movements (highest score from questions above): None, normal Incapacitation due to abnormal movements: None, normal Patient's awareness of abnormal movements (rate only patient's report): No Awareness, Dental Status Current problems with teeth and/or dentures?: No Does patient usually wear dentures?: No  CIWA:    COWS:     Musculoskeletal: Strength & Muscle Tone: within normal limits Gait & Station: normal  Gait antalgic due to L ankle pain Patient leans: N/A  Psychiatric Specialty Exam: Physical Exam  Review of Systems denies chest pain or  shortness of breath, reports pain, mainly on lower left extremity  Blood pressure 118/76, pulse (!) 129, height '6\' 2"'  (1.88 m), weight 70 kg.Body mass index is 19.81 kg/m.  General Appearance: Fairly Groomed  Eye Contact:  Good  Speech:  Normal Rate  Volume:  Normal  Mood:  denies feeling depressed  Affect:  vaguely anxious, not irritable at this time  Thought Process: currently presents linear, circumstantial  Orientation:  Other:  alert and attentive  Thought Content:  denies hallucinations and does not appear internally preoccupied   Suicidal Thoughts:  No currently denies suicidal or self injurious ideations , denies homicidal ideations  Homicidal Thoughts:  No  Memory:  recent and remote fair   Judgement:  Fair  Insight:  Fair  Psychomotor Activity:  Normal- no psychomotor agitation or restlessness at this time  Concentration:  Concentration: improving  and Attention Span: improving   Recall:  fair   Fund of Knowledge:  Fair  Language:  Good  Akathisia:  Negative  Handed:  Right  AIMS (if indicated):     Assets:  Desire for Improvement Resilience  ADL's:  Intact  Cognition:  WNL  Sleep:  Number of Hours: 3.5   Assessment-  29 year old male, admitted on 5/13 to trauma center after he stepped in front of a vehicle in a suicidal attempt. Required ORIF on R  Acetabulum. During admission patient exhibited aggressive, agitated behaviors and was followed by psychiatry consultants, was transferred to [facility] on medical clearance . He denied past psychiatric admissions or treatment .  Today patient is presenting with improvement compared to prior presentation/behavior ( and GF , with whom I spoke with patient's express consent, corroborates he is doing better). Currently denies SI and is hoping for discharge soon. Reports pain on ambulation, mainly on L leg.  Denies medication side effects at this time - currently on Depakote /Thorazine/ Klonopin  Treatment Plan Summary: Daily contact  with patient to assess  and evaluate symptoms and progress in treatment, Medication management, Plan inpatient treatment  and medications as below Encourage group and milieu participation Encourage efforts to work on abstinence/sobriety Continue Depakene 500 mgrs BID for mood disorder  Continue Thorazine 25 mgrs TID for mood disorder/psychotic symptoms Change Cogentin to 0.5 mgrs BID PRN for EPS as needed  Continue Amlodipine 10 mgrs QDAY for HTN Continue Percocet 5/325 mgr Q 6 hours PRN for pain  Decrease Klonopin to 0.25 mgrs BID for anxiety Continue agitation protocol ( Zyprexa, Ativan, Geodon) for acute agitation if needed  Continue Trazodone 50 mgrs QHS PRN for insomnia  Check Valproic Acid Serum level, TSH, Lipid Panel, HgbA1C  Will review with staff regarding use of LLE boot to help alleviate pain/discomfort  Jenne Campus, MD 01/10/2020, 10:27 AM

## 2020-01-10 NOTE — ED Triage Notes (Signed)
He comes to [place] from [facility] where he currently resides as an inpatient with c/o leg pain and fever. He arrives with a #18 I.V. in his left anticubital started by EMS. He is awake and in no distress.

## 2020-01-10 NOTE — Progress Notes (Addendum)
Pt's temperature was 103.3 at 1550 .  Pt had been given percocet at 11am  and tylenol at 1400.  Pt's left lower leg is warm to the touch, swollen and tight.    MD instructed RN to send pt to [facility].  RN called Darl Pikes who is the charge nurse at [facility].  This RN explained to Darl Pikes the reason pt needs to be evaluated at [facility].    RN Statistician (Non-Emergent) to arrange pt transport to [facility].  MHT accompanied pt to [facility].  Per Hilda Lias [facility]), MHT and RN gathered pt's belongings and sent belongings with MHT to [facility].    RN called pt's mom to give update on pt's status.  Mom said she would go to [facility] to be with patient.  Mom thanked Charity fundraiser for calling.

## 2020-01-11 ENCOUNTER — Other Ambulatory Visit: Payer: Self-pay

## 2020-01-11 ENCOUNTER — Encounter (HOSPITAL_COMMUNITY): Payer: Self-pay | Admitting: Family Medicine

## 2020-01-11 LAB — CBC
HCT: 26.4 % — ABNORMAL LOW (ref 39.0–52.0)
Hemoglobin: 8.4 g/dL — ABNORMAL LOW (ref 13.0–17.0)
MCH: 28.9 pg (ref 26.0–34.0)
MCHC: 31.8 g/dL (ref 30.0–36.0)
MCV: 90.7 fL (ref 80.0–100.0)
Platelets: 192 10*3/uL (ref 150–400)
RBC: 2.91 MIL/uL — ABNORMAL LOW (ref 4.22–5.81)
RDW: 15.5 % (ref 11.5–15.5)
WBC: 9.7 10*3/uL (ref 4.0–10.5)
nRBC: 0 % (ref 0.0–0.2)

## 2020-01-11 LAB — COMPREHENSIVE METABOLIC PANEL
ALT: 55 U/L — ABNORMAL HIGH (ref 0–44)
AST: 38 U/L (ref 15–41)
Albumin: 3.1 g/dL — ABNORMAL LOW (ref 3.5–5.0)
Alkaline Phosphatase: 159 U/L — ABNORMAL HIGH (ref 38–126)
Anion gap: 9 (ref 5–15)
BUN: 13 mg/dL (ref 6–20)
CO2: 30 mmol/L (ref 22–32)
Calcium: 8.6 mg/dL — ABNORMAL LOW (ref 8.9–10.3)
Chloride: 95 mmol/L — ABNORMAL LOW (ref 98–111)
Creatinine, Ser: 0.65 mg/dL (ref 0.61–1.24)
GFR calc Af Amer: >60 mL/min (ref >60)
GFR calc non Af Amer: >60 mL/min (ref >60)
Glucose, Bld: 111 mg/dL — ABNORMAL HIGH (ref 70–99)
Potassium: 3.6 mmol/L (ref 3.5–5.1)
Sodium: 134 mmol/L — ABNORMAL LOW (ref 135–145)
Total Bilirubin: 0.6 mg/dL (ref 0.3–1.2)
Total Protein: 6.5 g/dL (ref 6.5–8.1)

## 2020-01-11 LAB — MAGNESIUM: Magnesium: 2 mg/dL (ref 1.7–2.4)

## 2020-01-11 LAB — PHOSPHORUS: Phosphorus: 2.9 mg/dL (ref 2.5–4.6)

## 2020-01-11 MED ORDER — DOCUSATE SODIUM 100 MG PO CAPS
100.0000 mg | ORAL_CAPSULE | Freq: Two times a day (BID) | ORAL | Status: DC
  Administered 2020-01-11 – 2020-01-12 (×4): 100 mg via ORAL
  Filled 2020-01-11 (×4): qty 1

## 2020-01-11 MED ORDER — SODIUM CHLORIDE 0.9 % IV SOLN: INTRAVENOUS | Status: DC

## 2020-01-11 MED ORDER — AMLODIPINE BESYLATE 10 MG PO TABS
10.0000 mg | ORAL_TABLET | Freq: Every day | ORAL | Status: DC
  Administered 2020-01-11 – 2020-01-12 (×2): 10 mg via ORAL
  Filled 2020-01-11 (×2): qty 1

## 2020-01-11 MED ORDER — IBUPROFEN 200 MG PO TABS
400.0000 mg | ORAL_TABLET | Freq: Four times a day (QID) | ORAL | Status: DC | PRN
  Administered 2020-01-11 – 2020-01-12 (×2): 400 mg via ORAL
  Filled 2020-01-11 (×2): qty 2

## 2020-01-11 MED ORDER — OXYCODONE HCL 5 MG/5ML PO SOLN
5.0000 mg | ORAL | Status: DC | PRN
  Administered 2020-01-11: 5 mg via ORAL
  Administered 2020-01-13: 10 mg via ORAL
  Filled 2020-01-11: qty 5
  Filled 2020-01-11: qty 10

## 2020-01-11 MED ORDER — ENOXAPARIN SODIUM 40 MG/0.4ML ~~LOC~~ SOLN: 40.0000 mg | Freq: Every day | SUBCUTANEOUS | Status: DC

## 2020-01-11 MED ORDER — ACETAMINOPHEN 325 MG PO TABS
650.0000 mg | ORAL_TABLET | Freq: Four times a day (QID) | ORAL | Status: DC | PRN
  Administered 2020-01-11 – 2020-01-12 (×4): 650 mg via ORAL
  Filled 2020-01-11 (×4): qty 2

## 2020-01-11 MED ORDER — BENZTROPINE MESYLATE 0.5 MG PO TABS
0.5000 mg | ORAL_TABLET | Freq: Two times a day (BID) | ORAL | Status: DC
  Administered 2020-01-11 – 2020-01-12 (×4): 0.5 mg via ORAL
  Filled 2020-01-11 (×5): qty 1

## 2020-01-11 MED ORDER — CHLORPROMAZINE HCL 25 MG PO TABS
25.0000 mg | ORAL_TABLET | Freq: Three times a day (TID) | ORAL | Status: DC
  Administered 2020-01-11 – 2020-01-12 (×6): 25 mg via ORAL
  Filled 2020-01-11 (×8): qty 1

## 2020-01-11 MED ORDER — CLONAZEPAM 0.125 MG PO TBDP
0.5000 mg | ORAL_TABLET | Freq: Two times a day (BID) | ORAL | Status: DC
  Administered 2020-01-11 – 2020-01-12 (×4): 0.5 mg via ORAL
  Filled 2020-01-11 (×4): qty 4

## 2020-01-11 MED ORDER — LORAZEPAM 1 MG PO TABS
2.0000 mg | ORAL_TABLET | Freq: Four times a day (QID) | ORAL | Status: DC | PRN
  Administered 2020-01-11 – 2020-01-13 (×4): 2 mg via ORAL
  Filled 2020-01-11 (×5): qty 2

## 2020-01-11 MED ORDER — DIPHENHYDRAMINE HCL 25 MG PO CAPS
25.0000 mg | ORAL_CAPSULE | Freq: Every evening | ORAL | Status: DC | PRN
  Administered 2020-01-11 – 2020-01-13 (×3): 25 mg via ORAL
  Filled 2020-01-11 (×4): qty 1

## 2020-01-11 MED ORDER — VALPROIC ACID 250 MG/5ML PO SOLN
500.0000 mg | Freq: Two times a day (BID) | ORAL | Status: DC
  Administered 2020-01-11 – 2020-01-12 (×4): 500 mg via ORAL
  Filled 2020-01-11 (×5): qty 10

## 2020-01-11 MED ORDER — ENOXAPARIN SODIUM 40 MG/0.4ML ~~LOC~~ SOLN
SUBCUTANEOUS | Status: AC
  Administered 2020-01-11: 40 mg via SUBCUTANEOUS
  Filled 2020-01-11: qty 0.4

## 2020-01-11 NOTE — Plan of Care (Signed)

## 2020-01-11 NOTE — Progress Notes (Signed)
Admitting MD made aware that patient is on floor and that orders are needed.

## 2020-01-11 NOTE — Progress Notes (Signed)
PROGRESS NOTE    Paul BucklerMichael T Bender  ONG:[id number] DOB: 1990-08-20 DOA: 01/10/2020 PCP: Patient, No Pcp Per   Brief Narrative:  HPI per Dr. Benita Gutterhing Tu on 01/10/20 Paul BucklerMichael T Bender is a 29 y.o. male with medical history significant for recent suicide attempt admitted by psychiatry who presents from behavior health for concerns of fever.    Patient was recently admitted to [facility] on 5/12 as a level 1 trauma following a suicide attempt where he stepped in front of a tractor trailer and suffered numerous fractures requiring repair by ortho. He also briefly required intubation for acute hypoxia for 2 days. Psychiatrist was consulted and he had continued aggressive behavior requiring medication intervention and was ultimately transferred to psychiatric facility on 6/18.   Patient reports that yesterday he began to have a sore throat worse with swallowing. Denies seeing any white patches on tongue. No cough or runny nose.  No chest pain or shortness of breath. No headache. Complains of left ankle/distal LE pain. He has been walking on it for a few days with his CAM boot and noticed that it has been more swollen and painful. Denies any fever although he was noted to have one on 6/17 and had negative CXR and negative bilateral lower extremity doppler ultrasound. No nausea/vomiting/diarrhea. No abdominal pain. No pain to his other incisions from recent surgical sites.   He had leukocytosis of 13.3. Stable hgb of 9.2. Na of 128, K of 3.2. Glucose of 121. AST of 47, ALT of 75, and alkaline phosphatase of 185 which is stable from prior.   CT of the left LE shows only soft tissue swelling but no osteomyelitis or changes to recent surgical site.  **Interim History Continues to spike fever so we will give him antipyretics and add ibuprofen.  We will continue IV ceftriaxone for now. Will also start IVF   Assessment & Plan:   Principal Problem:   Cellulitis Active Problems:   Acetabulum fracture,  right (HCC)   Type III open displaced segmental fracture of shaft of left tibia   Suicidal ideation   Fracture of metacarpal base of left hand, closed, sequela   Anemia   Transaminitis  Fever likely secondary to cellulitis of the left LE s/p recent left tibia fracture and intramedullary nailing -Continue IV Rocephin -Had negative COVID test 6/18 -Continues to have fevers -WBC is improved and went from 13.3 is now 9.7 -Continue with antipyretics with acetaminophen as well as ibuprofen -Remains a little tachycardic -Blood cultures x2 showed no growth to date less than 12 hours -His chest x-ray showed no active cardiopulmonary disease. -Continue to monitor temperature curve as well as WBCs -Will start Gentle IV fluid hydration with normal saline at 75 mL's per hour  Normocytic anemia -Stable. Pt required 2 units pRBC during last admission from trauma-  -had Right pelvic hematoma w/ extravasation -Hemoglobin/hematocrit went from 9.2/27.2 is now 8.4/26.4 -Continue to monitor for signs and symptoms of bleeding; currently no overt bleeding noted -Repeat CBC in a.m.  Transaminitis/Abnormal LFTs -This has been persistent but improving. Unclear if related to trauma. -AST went from 47 and is now 38 -ALT went from 35 is now 55 -Continue to monitor and trend and repeat hepatic function panel in the a.m.  Recent R acetabular FX -Initialskeletaltractionper Dr. Nelson ChimesHaddix,S/P ORIF acetabulum 5/13 by Dr. Jena GaussHaddix. NWB until 6 weeks post op (01/15/20).   Recent L open tibia FX -S/P I&D, IMN by Dr. Jena GaussHaddix 5/12. Needs to wear splint to LLE  as he cannot actively dorsiflex ankle. NWB. -WBAT for transfers only.  Recent Displaced fracture of 1st left metacarpal -Underwent internal fixation by Dr. Melvyn Novas on 01/05/20.  -He was placed in a splint. He should remain in this for 2 weeks and then follow up in the office  Suicide Attempt/Mood Disorder/IVC admit -Admitted from behavior health  as IVC.  -Denies any Suicidal or homicidal ideations at this time.  -C/w Sitter at all times. -Patient had multiple outbursts during his recent hospitalist stay that required multiple psych evaluation and medication adjustments.  -Low concerns for NMS since he does not have AMS, rigidity and have cellulitis as source of fever.  -Continue clonazepam, Thorazine, Depakote and PRN ativan for now for med list in behavior health.  Psychiatry consultation has been placed  Hyperglycemia -Check Hemoglobin A1c in the AM. -Patient's blood sugar on admission was 121 and repeat blood sugar on CMP today was 111 -Continue to Monitor blood sugars carefully and if necessary will place on sensitive NovoLog sliding scale insulin AC  Hyponatremia/Hypochloremia -Patient sodium was 134 and chloride level is 95 and mildly low -Start normal saline at 75 MLS per hour  -Continue monitor and trend and repeat CMP in a.m.  DVT prophylaxis: Enoxaparin 40 mg subcu every 24 Code Status: FULL CODE Family Communication: No family present at bedside Disposition Plan: Pending further improvement of his Cellulitis   Consultants:   None  Procedures: None   Antimicrobials:  Anti-infectives (From admission, onward)   Start     Dose/Rate Route Frequency Ordered Stop   01/11/20 1800  cefTRIAXone (ROCEPHIN) 1 g in sodium chloride 0.9 % 100 mL IVPB     Discontinue     1 g 200 mL/hr over 30 Minutes Intravenous Every 24 hours 01/10/20 2238       Subjective: Seen and examined and feels ok. Continues to have fevers.  Denies any chest pain, shortness of breath, nausea or vomiting.  No other concerns or complaints at this time.  Objective: Vitals:   01/11/20 1135 01/11/20 1212 01/11/20 1307 01/11/20 1405  BP: 111/63 (!) 108/59 100/62 112/71  Pulse: (!) 110 (!) 103 (!) 111 (!) 102  Resp: 20 16 20 18   Temp: (!) 102.4 F (39.1 C) (!) 101.5 F (38.6 C) 99.7 F (37.6 C) 99.4 F (37.4 C)  TempSrc: Oral Oral Oral Oral   SpO2: 100% 98% 100%   Weight:      Height:        Intake/Output Summary (Last 24 hours) at 01/11/2020 1729 Last data filed at 01/11/2020 1402 Gross per 24 hour  Intake 1072 ml  Output 1700 ml  Net -628 ml   Filed Weights   01/10/20 2300  Weight: 68.2 kg   Examination: Physical Exam:  Constitutional: WN/WD African-American male in NAD and appears calm and comfortable Eyes: Lids and conjunctivae normal, sclerae anicteric  ENMT: External Ears, Nose appear normal. Grossly normal hearing Neck: Appears normal, supple, no cervical masses, normal ROM, no appreciable thyromegaly; no JVD Respiratory: Diminished to auscultation bilaterally, no wheezing, rales, rhonchi or crackles.  Unlabored breathing Cardiovascular: Tachycardic rate but regular rhythm, no murmurs / rubs / gallops. S1 and S2 auscultated.  Left leg is swollen and slightly erythematous Abdomen: Soft, non-tender, non-distended. Bowel sounds positive.  GU: Deferred. Musculoskeletal: No clubbing / cyanosis of digits/nails. No joint deformity upper and lower extremities.  Skin: Has multiple scars that he has a scar to right frontal scalp as well as a nonedematous clean suture on  the left first MTP.  He has a suture in the abdomen and various healing wounds in the lower extremities as well as having left leg that is warm to touch slightly erythematous as well as swollen.  Also has multiple tattoos diffusely scattered throughout his body Neurologic: CN 2-12 grossly intact with no focal deficits. Romberg sign cerebellar reflexes not assessed.  Psychiatric: Normal judgment and insight. Alert and oriented x 3.  Slightly depressed appearing and flat affect.   Data Reviewed: I have personally reviewed following labs and imaging studies  CBC: Recent Labs  Lab 01/07/20 0634 01/10/20 1728 01/11/20 0602  WBC 7.2 13.3* 9.7  NEUTROABS  --  10.4*  --   HGB 10.5* 9.2* 8.4*  HCT 32.1* 27.2* 26.4*  MCV 89.9 88.0 90.7  PLT 259 209 192    Basic Metabolic Panel: Recent Labs  Lab 01/07/20 0634 01/10/20 1728 01/11/20 0931  NA 135 128* 134*  K 3.9 3.2* 3.6  CL 94* 88* 95*  CO2 29 27 30   GLUCOSE 101* 121* 111*  BUN 14 20 13   CREATININE 0.84 1.03 0.65  CALCIUM 9.4 8.4* 8.6*  MG  --   --  2.0  PHOS  --   --  2.9   GFR: Estimated Creatinine Clearance: 130.2 mL/min (by C-G formula based on SCr of 0.65 mg/dL). Liver Function Tests: Recent Labs  Lab 01/07/20 0634 01/10/20 1728 01/11/20 0931  AST 44* 47* 38  ALT 75* 75* 55*  ALKPHOS 252* 185* 159*  BILITOT 0.5 0.6 0.6  PROT 6.7 7.1 6.5  ALBUMIN 3.4* 3.5 3.1*   No results for input(s): LIPASE, AMYLASE in the last 168 hours. No results for input(s): AMMONIA in the last 168 hours. Coagulation Profile: Recent Labs  Lab 01/10/20 1728  INR 1.2   Cardiac Enzymes: No results for input(s): CKTOTAL, CKMB, CKMBINDEX, TROPONINI in the last 168 hours. BNP (last 3 results) No results for input(s): PROBNP in the last 8760 hours. HbA1C: No results for input(s): HGBA1C in the last 72 hours. CBG: No results for input(s): GLUCAP in the last 168 hours. Lipid Profile: No results for input(s): CHOL, HDL, LDLCALC, TRIG, CHOLHDL, LDLDIRECT in the last 72 hours. Thyroid Function Tests: No results for input(s): TSH, T4TOTAL, FREET4, T3FREE, THYROIDAB in the last 72 hours. Anemia Panel: No results for input(s): VITAMINB12, FOLATE, FERRITIN, TIBC, IRON, RETICCTPCT in the last 72 hours. Sepsis Labs: Recent Labs  Lab 01/10/20 1728  LATICACIDVEN 1.5   Recent Results (from the past 240 hour(s))  SARS Coronavirus 2 by RT PCR (hospital order, performed in [facility] hospital lab) Nasopharyngeal Nasopharyngeal Swab     Status: None   Collection Time: 01/09/20  2:06 PM   Specimen: Nasopharyngeal Swab  Result Value Ref Range Status   SARS Coronavirus 2 NEGATIVE NEGATIVE Final    Comment: (NOTE) SARS-CoV-2 target nucleic acids are NOT DETECTED.  The SARS-CoV-2 RNA is generally  detectable in upper and lower respiratory specimens during the acute phase of infection. The lowest concentration of SARS-CoV-2 viral copies this assay can detect is 250 copies / mL. A negative result does not preclude SARS-CoV-2 infection and should not be used as the sole basis for treatment or other patient management decisions.  A negative result may occur with improper specimen collection / handling, submission of specimen other than nasopharyngeal swab, presence of viral mutation(s) within the areas targeted by this assay, and inadequate number of viral copies (<250 copies / mL). A negative result must be combined  with clinical observations, patient history, and epidemiological information.  Fact Sheet for Patients:   StrictlyIdeas.no  Fact Sheet for Healthcare Providers: BankingDealers.co.za  This test is not yet approved or  cleared by the [place] FDA and has been authorized for detection and/or diagnosis of SARS-CoV-2 by FDA under an Emergency Use Authorization (EUA).  This EUA will remain in effect (meaning this test can be used) for the duration of the COVID-19 declaration under Section 564(b)(1) of the Act, 21 U.S.C. section 360bbb-3(b)(1), unless the authorization is terminated or revoked sooner.  Performed at [facility], [place] [address] [place], [place] [address]   Culture, blood (Routine x 2)     Status: None (Preliminary result)   Collection Time: 01/10/20  5:17 PM   Specimen: BLOOD  Result Value Ref Range Status   Specimen Description   Final    BLOOD RIGHT ANTECUBITAL Performed at [place] [address], [place], [place] [address]    Special Requests   Final    BOTTLES DRAWN AEROBIC AND ANAEROBIC Blood Culture results may not be optimal due to an inadequate volume of blood received in culture bottles Performed at [place] [address],  [place], [place] [address]    Culture   Final    NO GROWTH < 12 HOURS Performed at [place] [place] [facility] [address] [place], [place] [address]    Report Status PENDING  Incomplete  Culture, blood (Routine x 2)     Status: None (Preliminary result)   Collection Time: 01/10/20  5:22 PM   Specimen: BLOOD  Result Value Ref Range Status   Specimen Description   Final    BLOOD RIGHT HAND Performed at [facility] [address], [place], [place] [address]    Special Requests   Final    BOTTLES DRAWN AEROBIC AND ANAEROBIC Blood Culture results may not be optimal due to an excessive volume of blood received in culture bottles Performed at [facility] [address] [place], [place] [address]    Culture   Final    NO GROWTH < 12 HOURS Performed at [facility] [address], [place], [place] [address]    Report Status PENDING  Incomplete     RN Pressure Injury Documentation:     Estimated body mass index is 19.3 kg/m as calculated from the following:   Height as of this encounter: 6\' 2"  (1.88 m).   Weight as of this encounter: 68.2 kg.  Malnutrition Type:      Malnutrition Characteristics:      Nutrition Interventions:    Radiology Studies: DG Chest 2 View  Result Date: 01/10/2020 CLINICAL DATA:  29 year old male with suspected sepsis. EXAM: CHEST - 2 VIEW COMPARISON:  Chest radiograph dated 01/08/2020. FINDINGS: The heart size and mediastinal contours are within normal limits. Both lungs are clear. The visualized skeletal structures are unremarkable. IMPRESSION: No active cardiopulmonary disease. Electronically Signed   By: Anner Crete M.D.   On: 01/10/2020 18:35   CT TIBIA FIBULA LEFT W CONTRAST  Result Date: 01/10/2020 CLINICAL DATA:  Right ankle in calf pain and swelling. Fever. Previous tibial fixation EXAM: CT OF THE LOWER RIGHT EXTREMITY WITH CONTRAST TECHNIQUE: Multidetector CT imaging of the lower right  extremity was performed according to the standard protocol following intravenous contrast administration. COMPARISON:  X-ray 12/03/2019, 12/18/2019 CONTRAST:  17mL OMNIPAQUE IOHEXOL 300 MG/ML  SOLN FINDINGS: Bones/Joint/Cartilage Redemonstration of comminuted  proximal and distal tibial diaphyseal fractures status post ORIF with long intramedullary rod and proximal and distal interlocking screws. Bone graft material is noted at the mid to distal tibial diaphysis. There is periosteal new bone formation associated with the fracture sites, most prevalent at the proximal fracture location. Hardware appears intact. Osseous alignment is unchanged and remains near anatomic. No new fracture. No areas of cortical destruction are identified. Comminuted, mildly displaced mid to distal fibular diaphyseal fracture with 5.0 cm butterfly fragment which is slightly posteriorly displaced. There is also periosteal new bone formation at the fibular fracture site. No change in alignment from previous radiographs. Small amount of periosteal new bone formation along the anteromedial margin of the fibular head, which may represent a healing nondisplaced fracture or mechanical irritation from proximal-most tibial screw (series 3, image 56). No areas of cortical destruction are identified. Alignment at the knee and ankle are maintained. Joint spaces are preserved. No tibiotalar joint effusion or ankle joint effusion. Ligaments Suboptimally assessed by CT. Muscles and Tendons No well-defined intramuscular fluid collection. The tendinous structures appear intact within the limitations of CT. Soft tissues There is circumferential soft tissue edema at the level of the distal lower leg and ankle. No well-defined fluid collection. No soft tissue gas. No deep soft tissue ulceration. IMPRESSION: 1. Circumferential soft tissue edema at the level of the distal lower leg and ankle. No well-defined fluid collection or soft tissue gas. 2. No evidence to  suggest osteomyelitis. 3. Redemonstration of comminuted proximal and distal tibial diaphyseal fractures status post ORIF with long intramedullary rod and proximal and distal interlocking screws. Hardware appears intact. Osseous alignment is unchanged and remains near-anatomic. 4. Small amount of periosteal new bone formation along the anteromedial margin of the fibular head, which may represent a healing nondisplaced fracture or mechanical irritation from the proximal-most tibial screw. Electronically Signed   By: Duanne Guess D.O.   On: 01/10/2020 19:09   Scheduled Meds: . amLODipine  10 mg Oral Daily  . benztropine  0.5 mg Oral BID  . chlorproMAZINE  25 mg Oral TID  . clonazePAM  0.5 mg Oral BID  . docusate sodium  100 mg Oral BID  . enoxaparin (LOVENOX) injection  40 mg Subcutaneous QHS  . valproic acid  500 mg Oral BID   Continuous Infusions: . cefTRIAXone (ROCEPHIN)  IV      LOS: 1 day   Merlene Laughter, DO [facility] PAGER is on AMION  If 7PM-7AM, please contact night-coverage [email or web address]

## 2020-01-11 NOTE — Progress Notes (Signed)
Patient in red news. Notified charge nurse Toniann Fail RN and rapid response nurse Verdia Kuba, RN. Patient is A & O x 4 and is stable at this time and zero signs of distress. Gave PRN tylenol. Wendy notified Dr. Marland Mcalpine by [facility] page. See new orders. Red MEWS protocol initiated.   VS at 1008 T 102.6 BP 117/81 HR 112 RR 18 O2 = 100 % RA  VS at 1135  T 102.4 BP 111/63 HR 110 RR 20 O2  100 % RA

## 2020-01-12 ENCOUNTER — Encounter (HOSPITAL_COMMUNITY): Payer: Self-pay | Admitting: Family Medicine

## 2020-01-12 LAB — COMPREHENSIVE METABOLIC PANEL
ALT: 54 U/L — ABNORMAL HIGH (ref 0–44)
AST: 48 U/L — ABNORMAL HIGH (ref 15–41)
Albumin: 2.6 g/dL — ABNORMAL LOW (ref 3.5–5.0)
Alkaline Phosphatase: 135 U/L — ABNORMAL HIGH (ref 38–126)
Anion gap: 8 (ref 5–15)
BUN: 14 mg/dL (ref 6–20)
CO2: 26 mmol/L (ref 22–32)
Calcium: 8.1 mg/dL — ABNORMAL LOW (ref 8.9–10.3)
Chloride: 100 mmol/L (ref 98–111)
Creatinine, Ser: 0.77 mg/dL (ref 0.61–1.24)
GFR calc Af Amer: >60 mL/min (ref >60)
GFR calc non Af Amer: >60 mL/min (ref >60)
Glucose, Bld: 105 mg/dL — ABNORMAL HIGH (ref 70–99)
Potassium: 4.1 mmol/L (ref 3.5–5.1)
Sodium: 134 mmol/L — ABNORMAL LOW (ref 135–145)
Total Bilirubin: 0.5 mg/dL (ref 0.3–1.2)
Total Protein: 5.8 g/dL — ABNORMAL LOW (ref 6.5–8.1)

## 2020-01-12 LAB — CBC WITH DIFFERENTIAL/PLATELET
Abs Immature Granulocytes: 0.1 10*3/uL — ABNORMAL HIGH (ref 0.00–0.07)
Basophils Absolute: 0 10*3/uL (ref 0.0–0.1)
Basophils Relative: 0 %
Eosinophils Absolute: 0.1 10*3/uL (ref 0.0–0.5)
Eosinophils Relative: 2 %
HCT: 23.8 % — ABNORMAL LOW (ref 39.0–52.0)
Hemoglobin: 7.9 g/dL — ABNORMAL LOW (ref 13.0–17.0)
Immature Granulocytes: 2 %
Lymphocytes Relative: 14 %
Lymphs Abs: 0.8 10*3/uL (ref 0.7–4.0)
MCH: 29.2 pg (ref 26.0–34.0)
MCHC: 33.2 g/dL (ref 30.0–36.0)
MCV: 87.8 fL (ref 80.0–100.0)
Monocytes Absolute: 0.8 10*3/uL (ref 0.1–1.0)
Monocytes Relative: 14 %
Neutro Abs: 3.9 10*3/uL (ref 1.7–7.7)
Neutrophils Relative %: 68 %
Platelets: 180 10*3/uL (ref 150–400)
RBC: 2.71 MIL/uL — ABNORMAL LOW (ref 4.22–5.81)
RDW: 15.7 % — ABNORMAL HIGH (ref 11.5–15.5)
WBC: 5.7 10*3/uL (ref 4.0–10.5)
nRBC: 0 % (ref 0.0–0.2)

## 2020-01-12 LAB — MAGNESIUM: Magnesium: 1.9 mg/dL (ref 1.7–2.4)

## 2020-01-12 LAB — PHOSPHORUS: Phosphorus: 3.2 mg/dL (ref 2.5–4.6)

## 2020-01-12 MED ORDER — ENOXAPARIN SODIUM 30 MG/0.3ML ~~LOC~~ SOLN
30.0000 mg | Freq: Two times a day (BID) | SUBCUTANEOUS | Status: DC
  Administered 2020-01-12: 30 mg via SUBCUTANEOUS
  Filled 2020-01-12 (×2): qty 0.3

## 2020-01-12 NOTE — Progress Notes (Addendum)
MEWS RED. Pt AOx4. No distress. Will administer medication for fever and notify covering provider via aimion. Charge RN Dwana Curd made aware. RED MEWS protocol re-initiated.    01/12/20 0222  Assess: MEWS Score  Temp (!) 102.5 F (39.2 C)  BP (!) 117/58  Pulse Rate (!) 113  Resp 18  SpO2 100 %  O2 Device Room Air  Assess: MEWS Score  MEWS Temp 2  MEWS Systolic 0  MEWS Pulse 2  MEWS RR 0  MEWS LOC 0  MEWS Score 4  MEWS Score Color Red  Assess: if the MEWS score is Yellow or Red  Were vital signs taken at a resting state? Yes

## 2020-01-12 NOTE — Consult Note (Signed)
  Paul Bender, 29 y.o., male patient seen face to face by this provider, consulted with Dr. Lucianne Muss; and chart reviewed on 01/12/20.  On evaluation Paul Bender reports he is feeling better and doesn't feel that he needs to be readmitted to psychiatric hospital.  States that he has been doing well and feels that he is just going in circles from hospital to hospital and feels that he is ready to go home.  States that he is interested in outpatient psychiatric services.  Patient states that he currently lives with his girlfriend, one yr old daughter, and his mother who are all supportive.   During evaluation Paul Bender is alert/oriented x 4; calm/cooperative; and mood is congruent with affect.  He does not appear to be responding to internal/external stimuli or delusional thoughts.  Patient denies suicidal/self-harm/homicidal ideation, psychosis, and paranoia.  Patient answered question appropriately.    Social work consult ordered to set up outpatient psychiatric services.    Disposition:  Psychiatrically cleared No evidence of imminent risk to self or others at present.   Patient does not meet criteria for psychiatric inpatient admission. Supportive therapy provided about ongoing stressors. Discussed crisis plan, support from social network, calling 911, coming to the Emergency Department, and calling Suicide Hotline.  Spoke with Dr. Marland Mcalpine and informed of recommendation and disposition

## 2020-01-12 NOTE — Progress Notes (Signed)
   01/12/20 1818  Assess: MEWS Score  Temp (!) 100.6 F (38.1 C)  BP 134/81  Pulse Rate (!) 117  Resp 18  SpO2 100 %  O2 Device Room Air  Assess: MEWS Score  MEWS Temp 1  MEWS Systolic 0  MEWS Pulse 2  MEWS RR 0  MEWS LOC 0  MEWS Score 3  MEWS Score Color Yellow  Assess: if the MEWS score is Yellow or Red  Were vital signs taken at a resting state? Yes  Focused Assessment Documented focused assessment  Early Detection of Sepsis Score *See Row Information* Low  MEWS guidelines implemented *See Row Information* No, previously red, continue vital signs every 4 hours  Treat  MEWS Interventions Administered prn meds/treatments  Document  Progress note created (see row info) Yes  Patient has been red mews since admission, will not start protocol, administered PRN meds, will continue to monitor.

## 2020-01-12 NOTE — Progress Notes (Signed)
PROGRESS NOTE    Paul Bender  HUT:[id number] DOB: 08-Jun-1991 DOA: 01/10/2020 PCP: Patient, No Pcp Per   Brief Narrative:  HPI per Dr. Benita Gutter on 01/10/20 Paul Bender is a 29 y.o. male with medical history significant for recent suicide attempt admitted by psychiatry who presents from behavior health for concerns of fever.    Patient was recently admitted to [facility] on 5/12 as a level 1 trauma following a suicide attempt where he stepped in front of a tractor trailer and suffered numerous fractures requiring repair by ortho. He also briefly required intubation for acute hypoxia for 2 days. Psychiatrist was consulted and he had continued aggressive behavior requiring medication intervention and was ultimately transferred to psychiatric facility on 6/18.   Patient reports that yesterday he began to have a sore throat worse with swallowing. Denies seeing any white patches on tongue. No cough or runny nose.  No chest pain or shortness of breath. No headache. Complains of left ankle/distal LE pain. He has been walking on it for a few days with his CAM boot and noticed that it has been more swollen and painful. Denies any fever although he was noted to have one on 6/17 and had negative CXR and negative bilateral lower extremity doppler ultrasound. No nausea/vomiting/diarrhea. No abdominal pain. No pain to his other incisions from recent surgical sites.   He had leukocytosis of 13.3. Stable hgb of 9.2. Na of 128, K of 3.2. Glucose of 121. AST of 47, ALT of 75, and alkaline phosphatase of 185 which is stable from prior.   CT of the left LE shows only soft tissue swelling but no osteomyelitis or changes to recent surgical site.  **Interim History Continues to spike fever so we will give him antipyretics and add ibuprofen and spiked a temperature early this morning of 102.7 we will continue IV ceftriaxone for now as his lower extremity swelling and warmth is improving. Will also start  IVF and he is getting normal saline at 25 mils per hour.  We will change enoxaparin to 30 mg subcu every 12 per trauma recommendations at discharge.  Assessment & Plan:   Principal Problem:   Cellulitis Active Problems:   Acetabulum fracture, right (HCC)   Type III open displaced segmental fracture of shaft of left tibia   Suicidal ideation   Fracture of metacarpal base of left hand, closed, sequela   Anemia   Transaminitis  Fever likely secondary to cellulitis of the left LE s/p recent left tibia fracture and intramedullary nailing, persistent slightly -Continue IV Rocephin -Had negative COVID test 6/18 -Continues to have fevers -WBC is improved and went from 13.3 is now 5.7 -Continue with antipyretics with acetaminophen as well as ibuprofen -Remains a little tachycardic -Blood cultures x2 showed no growth to date at 2 days -His chest x-ray showed no active cardiopulmonary disease on 01/10/2020 -Continue to monitor temperature curve as well as WBCs; needs to spike temperatures and WBC is trending down-we will check a procalcitonin level in the morning as well as a lactic acid level -If he continues to spike temperatures we will obtain a CT of the chest, CT of the abdomen pelvis tomorrow -Continue Gentle IV fluid hydration with normal saline at 75 mL's per hour  Normocytic Anemia -Stable. Pt required 2 units pRBC during last admission from trauma -had Right pelvic hematoma w/ extravasation -Hemoglobin/hematocrit went from 9.2/27.2 -> 8.4/26.4 -> 7.9/23.8 alcohol could be dilutional drop but will need to monitor for active  signs and symptoms of bleeding -May need to repeat a CT scan of the abdomen pelvis given his history of right pelvic hematoma with extravasation -Continue to monitor for signs and symptoms of bleeding; currently no overt bleeding noted -Repeat CBC in a.m.  Transaminitis/Abnormal LFTs -This has been persistent but improving. Unclear if related to trauma. -AST  went from 47 and is now 69 yesterday but is worsened today is 48 -ALT went from 35 is now 55 yesterday and today is 54 -Continue to monitor and trend and repeat hepatic function panel in the a.m.  Recent R acetabular FX -Initialskeletaltractionper Dr. Nelson Chimes ORIF acetabulum 5/13 by Dr. Jena Gauss. NWB until 6 weeks post op (01/15/20).   Recent L open tibia FX -S/P I&D, IMN by Dr. Jena Gauss 5/12. Needs to wear splint to LLE as he cannot actively dorsiflex ankle. NWB. -WBAT for transfers only.  Recent Displaced fracture of 1st left metacarpal -Underwent internal fixation by Dr. Melvyn Novas on 01/05/20.  -He was placed in a splint. He should remain in this for 2 weeks and then follow up in the office  Suicide Attempt/Mood Disorder/IVC admit -Admitted from behavior health as IVC.  -Denies any Suicidal or homicidal ideations at this time.  -C/w Sitter at all times. -Patient had multiple outbursts during his recent hospitalist stay that required multiple psych evaluation and medication adjustments.  -Low concerns for NMS since he does not have AMS, rigidity and have cellulitis as source of fever.  -Continue Conazepam, Thorazine, Depakote and PRN ativan for now for med list in behavior health.  Psychiatry consultation has been placed  Hyperglycemia -Check Hemoglobin A1c in the AM. -Patient's blood sugar on admission was 121 and repeat blood sugar on CMP today was 111 -Continue to Monitor blood sugars carefully and if necessary will place on sensitive NovoLog sliding scale insulin AC  Hyponatremia/Hypochloremia -Patient sodium was again 134 and chloride level is 100 and improved -Start normal saline at 75 MLS per hour  -Continue monitor and trend and repeat CMP in a.m.  DVT prophylaxis: Enoxaparin 40 mg subcu every 24 changed to enoxaparin 30 mg subcu every 12 Code Status: FULL CODE Family Communication: No family present at bedside Disposition Plan: Pending further  improvement of his Cellulitis   Consultants:   Psychiatry  Procedures: None   Antimicrobials:  Anti-infectives (From admission, onward)   Start     Dose/Rate Route Frequency Ordered Stop   01/11/20 1800  cefTRIAXone (ROCEPHIN) 1 g in sodium chloride 0.9 % 100 mL IVPB     Discontinue     1 g 200 mL/hr over 30 Minutes Intravenous Every 24 hours 01/10/20 2238       Subjective: Seen and examined and he was resting.  Continues to have fevers and had a temperature of 102.7 this morning.  Denies any chest pain, lightheadedness or dizziness.  States that he does have a cough but is coughing up some whitish clear sputum.  No burning or discomfort in his urine.  No other concerns or complaints at this time.  Objective: Vitals:   01/12/20 0329 01/12/20 0432 01/12/20 0526 01/12/20 0956  BP: (!) 103/49  (!) 107/57 113/61  Pulse: (!) 114 (!) 115 96 88  Resp: Temp: (!) 102.7 F (39.3 C) (!) 101 F (38.3 C) 99.3 F (37.4 C) 98.1 F (36.7 C)  TempSrc: Oral Oral Oral Oral  SpO2: 98% 99% 98% 100%  Weight:      Height:  Intake/Output Summary (Last 24 hours) at 01/12/2020 1331 Last data filed at 01/12/2020 1318 Gross per 24 hour  Intake 2711.12 ml  Output 3250 ml  Net -538.88 ml   Filed Weights   01/10/20 2300  Weight: 68.2 kg   Examination: Physical Exam:  Constitutional: WN/WD in no acute distress appears calm and comfortable but he continues to spike temperatures and had one this morning. Eyes: Lids and conjunctivae normal, sclerae anicteric  ENMT: External Ears, Nose appear normal. Grossly normal hearing. Neck: Appears normal, supple, no cervical masses, normal ROM, no appreciable thyromegaly; no JVD Respiratory: Diminished to auscultation bilaterally, no wheezing, rales, rhonchi or crackles. Normal respiratory effort and patient is not tachypenic. No accessory muscle use.  Unlabored breathing Cardiovascular: RRR, no murmurs / rubs / gallops. S1 and S2  auscultated.  Abdomen: Soft, non-tender, nondistended.  Bowel sounds positive.  GU: Deferred. Musculoskeletal: No clubbing / cyanosis of digits/nails. No joint deformity upper and lower extremities.  Skin: Multiple scars and is a scar to the right frontal scalp as well as a suture in the left first MTP he also has a suture in the abdomen very healing wound in the lower extremities.  Left leg is still swollen but less erythematous and less swollen today.  Is multiple tattoos noted diffusely scattered throughout his body. No induration; Warm and dry.  Neurologic: CN 2-12 grossly intact with no focal deficits. Romberg sign and cerebellar reflexes not assessed.  Psychiatric: Normal judgment and insight. Alert and oriented x 3. Normal mood and appropriate affect.   Data Reviewed: I have personally reviewed following labs and imaging studies  CBC: Recent Labs  Lab 01/07/20 0634 01/10/20 1728 01/11/20 0602 01/12/20 0403  WBC 7.2 13.3* 9.7 5.7  NEUTROABS  --  10.4*  --  3.9  HGB 10.5* 9.2* 8.4* 7.9*  HCT 32.1* 27.2* 26.4* 23.8*  MCV 89.9 88.0 90.7 87.8  PLT 259 209 192 180   Basic Metabolic Panel: Recent Labs  Lab 01/07/20 0634 01/10/20 1728 01/11/20 0931 01/12/20 0403  NA 135 128* 134* 134*  K 3.9 3.2* 3.6 4.1  CL 94* 88* 95* 100  CO2 29 27 30 26   GLUCOSE 101* 121* 111* 105*  BUN 14 20 13 14   CREATININE 0.84 1.03 0.65 0.77  CALCIUM 9.4 8.4* 8.6* 8.1*  MG  --   --  2.0 1.9  PHOS  --   --  2.9 3.2   GFR: Estimated Creatinine Clearance: 130.2 mL/min (by C-G formula based on SCr of 0.77 mg/dL). Liver Function Tests: Recent Labs  Lab 01/07/20 0634 01/10/20 1728 01/11/20 0931 01/12/20 0403  AST 44* 47* 38 48*  ALT 75* 75* 55* 54*  ALKPHOS 252* 185* 159* 135*  BILITOT 0.5 0.6 0.6 0.5  PROT 6.7 7.1 6.5 5.8*  ALBUMIN 3.4* 3.5 3.1* 2.6*   No results for input(s): LIPASE, AMYLASE in the last 168 hours. No results for input(s): AMMONIA in the last 168 hours. Coagulation  Profile: Recent Labs  Lab 01/10/20 1728  INR 1.2   Cardiac Enzymes: No results for input(s): CKTOTAL, CKMB, CKMBINDEX, TROPONINI in the last 168 hours. BNP (last 3 results) No results for input(s): PROBNP in the last 8760 hours. HbA1C: No results for input(s): HGBA1C in the last 72 hours. CBG: No results for input(s): GLUCAP in the last 168 hours. Lipid Profile: No results for input(s): CHOL, HDL, LDLCALC, TRIG, CHOLHDL, LDLDIRECT in the last 72 hours. Thyroid Function Tests: No results for input(s): TSH, T4TOTAL, FREET4, T3FREE,  THYROIDAB in the last 72 hours. Anemia Panel: No results for input(s): VITAMINB12, FOLATE, FERRITIN, TIBC, IRON, RETICCTPCT in the last 72 hours. Sepsis Labs: Recent Labs  Lab 01/10/20 1728  LATICACIDVEN 1.5   Recent Results (from the past 240 hour(s))  SARS Coronavirus 2 by RT PCR (hospital order, performed in [facility] hospital lab) Nasopharyngeal Nasopharyngeal Swab     Status: None   Collection Time: 01/09/20  2:06 PM   Specimen: Nasopharyngeal Swab  Result Value Ref Range Status   SARS Coronavirus 2 NEGATIVE NEGATIVE Final    Comment: (NOTE) SARS-CoV-2 target nucleic acids are NOT DETECTED.  The SARS-CoV-2 RNA is generally detectable in upper and lower respiratory specimens during the acute phase of infection. The lowest concentration of SARS-CoV-2 viral copies this assay can detect is 250 copies / mL. A negative result does not preclude SARS-CoV-2 infection and should not be used as the sole basis for treatment or other patient management decisions.  A negative result may occur with improper specimen collection / handling, submission of specimen other than nasopharyngeal swab, presence of viral mutation(s) within the areas targeted by this assay, and inadequate number of viral copies (<250 copies / mL). A negative result must be combined with clinical observations, patient history, and epidemiological information.  Fact Sheet for  Patients:   StrictlyIdeas.no  Fact Sheet for Healthcare Providers: BankingDealers.co.za  This test is not yet approved or  cleared by the [place] FDA and has been authorized for detection and/or diagnosis of SARS-CoV-2 by FDA under an Emergency Use Authorization (EUA).  This EUA will remain in effect (meaning this test can be used) for the duration of the COVID-19 declaration under Section 564(b)(1) of the Act, 21 U.S.C. section 360bbb-3(b)(1), unless the authorization is terminated or revoked sooner.  Performed at [facility], [place] [address], [place], [place] [address]   Culture, blood (Routine x 2)     Status: None (Preliminary result)   Collection Time: 01/10/20  5:17 PM   Specimen: BLOOD  Result Value Ref Range Status   Specimen Description   Final    BLOOD RIGHT ANTECUBITAL Performed at [facility] [address] [place], [place] [address]    Special Requests   Final    BOTTLES DRAWN AEROBIC AND ANAEROBIC Blood Culture results may not be optimal due to an inadequate volume of blood received in culture bottles Performed at [facility] [address], [place], [place] [address]    Culture   Final    NO GROWTH 2 DAYS Performed at [facility] [address], [place], [place] [address]    Report Status PENDING  Incomplete  Culture, blood (Routine x 2)     Status: None (Preliminary result)   Collection Time: 01/10/20  5:22 PM   Specimen: BLOOD  Result Value Ref Range Status   Specimen Description   Final    BLOOD RIGHT HAND Performed at [facility] [address] [place], [place] [address]    Special Requests   Final    BOTTLES DRAWN AEROBIC AND ANAEROBIC Blood Culture results may not be optimal due to an excessive volume of blood received in culture bottles Performed at [facility] [address], [place], [place]  [address]    Culture   Final    NO GROWTH 2 DAYS Performed at [facility] [address], [place], [place] [address]    Report Status PENDING  Incomplete     RN Pressure Injury Documentation:     Estimated body mass index is 19.3 kg/m as calculated from the following:   Height as of this encounter: 6\' 2"  (1.88 m).   Weight as of this encounter: 68.2 kg.  Malnutrition Type:      Malnutrition Characteristics:      Nutrition Interventions:    Radiology Studies: DG Chest 2 View  Result Date: 01/10/2020 CLINICAL DATA:  29 year old male with suspected sepsis. EXAM: CHEST - 2 VIEW COMPARISON:  Chest radiograph dated 01/08/2020. FINDINGS: The heart size and mediastinal contours are within normal limits. Both lungs are clear. The visualized skeletal structures are unremarkable. IMPRESSION: No active cardiopulmonary disease. Electronically Signed   By: 01/10/2020 M.D.   On: 01/10/2020 18:35   CT TIBIA FIBULA LEFT W CONTRAST  Result Date: 01/10/2020 CLINICAL DATA:  Right ankle in calf pain and swelling. Fever. Previous tibial fixation EXAM: CT OF THE LOWER RIGHT EXTREMITY WITH CONTRAST TECHNIQUE: Multidetector CT imaging of the lower right extremity was performed according to the standard protocol following intravenous contrast administration. COMPARISON:  X-ray 12/03/2019, 12/18/2019 CONTRAST:  12/20/2019 OMNIPAQUE IOHEXOL 300 MG/ML  SOLN FINDINGS: Bones/Joint/Cartilage Redemonstration of comminuted proximal and distal tibial diaphyseal fractures status post ORIF with long intramedullary rod and proximal and distal interlocking screws. Bone graft material is noted at the mid to distal tibial diaphysis. There is periosteal new bone formation associated with the fracture sites, most prevalent at the proximal fracture location. Hardware appears intact. Osseous alignment is unchanged and remains near anatomic. No new fracture. No areas of cortical destruction are identified. Comminuted,  mildly displaced mid to distal fibular diaphyseal fracture with 5.0 cm butterfly fragment which is slightly posteriorly displaced. There is also periosteal new bone formation at the fibular fracture site. No change in alignment from previous radiographs. Small amount of periosteal new bone formation along the anteromedial margin of the fibular head, which may represent a healing nondisplaced fracture or mechanical irritation from proximal-most tibial screw (series 3, image 56). No areas of cortical destruction are identified. Alignment at the knee and ankle are maintained. Joint spaces are preserved. No tibiotalar joint effusion or ankle joint effusion. Ligaments Suboptimally assessed by CT. Muscles and Tendons No well-defined intramuscular fluid collection. The tendinous structures appear intact within the limitations of CT. Soft tissues There is circumferential soft tissue edema at the level of the distal lower leg and ankle. No well-defined fluid collection. No soft tissue gas. No deep soft tissue ulceration. IMPRESSION: 1. Circumferential soft tissue edema at the level of the distal lower leg and ankle. No well-defined fluid collection or soft tissue gas. 2. No evidence to suggest osteomyelitis. 3. Redemonstration of comminuted proximal and distal tibial diaphyseal fractures status post ORIF with long intramedullary rod and proximal and distal interlocking screws. Hardware appears intact. Osseous alignment is unchanged and remains near-anatomic. 4. Small amount of periosteal new bone formation along the anteromedial margin of the fibular head, which may represent a healing nondisplaced fracture or mechanical irritation from the proximal-most tibial screw. Electronically Signed   By: D.O.   On: 01/10/2020 19:09   Scheduled Meds: . amLODipine  10 mg Oral Daily  . benztropine  0.5 mg Oral BID  . chlorproMAZINE  25 mg Oral TID  . clonazePAM  0.5 mg Oral BID  . docusate sodium  100 mg Oral BID   . enoxaparin (LOVENOX) injection  40 mg Subcutaneous QHS  . valproic acid  500  mg Oral BID   Continuous Infusions: . sodium chloride 75 mL/hr at 01/12/20 1042  . cefTRIAXone (ROCEPHIN)  IV 1 g (01/11/20 1850)    LOS: 2 days   Merlene Laughtermair Latif Jaxtyn [place], DO [facility] PAGER is on AMION  If 7PM-7AM, please contact night-coverage [email or web address]

## 2020-01-12 NOTE — Progress Notes (Addendum)
Pt continues with elevated temp, more ice packs provided. Tylenol administered. Pt alert. No distress noted

## 2020-01-12 NOTE — Progress Notes (Addendum)
Iv infiltrated. Pt refusing iv to be replaced at this time. Requested ativan and benadryl for sleep. Meds administered. Will reattempt IV placement when patient more calm. Dr Antionette Char made aware of situation.

## 2020-01-12 NOTE — Progress Notes (Signed)
   01/12/20 0432  Vitals  Temp (!) 101 F (38.3 C)  Temp Source Oral  BP  (Pt uncooperative )  Pulse Rate (!) 115  Pulse Rate Source Dinamap  Oxygen Therapy  SpO2 99 %  O2 Device Room Air  MEWS Score  MEWS Temp 1  MEWS Systolic 0  MEWS Pulse 2  MEWS RR 0  MEWS LOC 0  MEWS Score 3  MEWS Score Color Yellow  patient refused blood pressure measurement. Pt sleepy but easily awakens. Respirations 20. No changes in circulation to bilateral legs.

## 2020-01-13 NOTE — Progress Notes (Signed)
   01/13/20 0910  Assess: MEWS Score  Temp (!) 100.5 F (38.1 C)  BP 134/74  Pulse Rate (!) 104  Resp 18  SpO2 100 %  O2 Device Room Air  Assess: MEWS Score  MEWS Temp 1  MEWS Systolic 0  MEWS Pulse 1  MEWS RR 0  MEWS LOC 0  MEWS Score 2  MEWS Score Color Yellow  Assess: if the MEWS score is Yellow or Red  Were vital signs taken at a resting state? Yes  Focused Assessment Documented focused assessment  Early Detection of Sepsis Score *See Row Information* Low  MEWS guidelines implemented *See Row Information* Yes  Treat  MEWS Interventions Administered prn meds/treatments  Take Vital Signs  Increase Vital Sign Frequency  Yellow: Q 2hr X 2 then Q 4hr X 2, if remains yellow, continue Q 4hrs  Escalate  MEWS: Escalate Yellow: discuss with charge nurse/RN and consider discussing with provider and RRT  Notify: Charge Nurse/RN  Name of Charge Nurse/RN Notified Hannah, RN  Date Charge Nurse/RN Notified 01/13/20  Time Charge Nurse/RN Notified 0930  Notify: Provider  Provider Name/Title Dr. Marland Mcalpine  Date Provider Notified 01/13/20  Time Provider Notified 0930  Notification Type Page  Notification Reason Change in status  Response No new orders  Notify: Rapid Response  Name of Rapid Response RN Notified N/A  Document  Patient Outcome Other (Comment) (patient decided to leave AMA)   Patient alerted yellow MEWS due to temp and HR. Patient was assessed by provider and told provider that he wants to be discharged. Provider did not feel comfortable discharging patient with patient having intermittent spikes in temperature. Patient decided to leave AMA. Patient signed AMA paperwork with RN as witness. IV catheter discontinued and telemetry removed. Patient left via private vehicle.

## 2020-01-13 NOTE — Progress Notes (Signed)
Patient left AMA.

## 2020-01-13 NOTE — Discharge Summary (Signed)
Physician Discharge Summary  RYLAND TUNGATE SEG:[id number] DOB: [birth year] DOA: 01/10/2020  PCP: Patient, No Pcp Per  Admit date: 01/10/2020 Discharge date: 01/13/2020  Admitted From: [facility] Disposition: Left AMA (Psych cleared and IVC rescinded)   Recommendations for Outpatient Follow-up:  1. Follow up with PCP in 1-2 weeks 2. Follow up with Trauma Surgery 3. Follow up with Orthopedic Surgery 4. Follow up with Psychiatry  5. Please obtain CMP/CBC, Mag, Phos in one week 6. Please follow up on the following pending results:  Home Health: No Equipment/Devices: None  Discharge Condition: Guarded  CODE STATUS: FULL CODE Diet recommendation: Heart Healthy Diet  Brief/Interim Summary: HPI per Dr. Ileene Musa on 01/10/20 Alphonse Guild McIntyreis a 29 y.o.malewith medical history significant forrecent suicide attempt admitted by psychiatry who presents from behavior health for concerns of fever.   Patient was recently admitted to [facility] on 5/12 as a level 1 trauma following a suicide attempt where he stepped in front of a tractor trailer and suffered numerous fractures requiring repair by ortho. He also briefly required intubation for acute hypoxia for 2 days. Psychiatrist was consulted and he had continued aggressive behavior requiring medication intervention and was ultimately transferred to psychiatric facility on 6/18.   Patient reports that yesterday he began to have a sore throat worse with swallowing. Denies seeing any white patches on tongue. No cough or runny nose. No chest pain or shortness of breath. No headache. Complains of left ankle/distal LE pain. He has been walking on it for a few days with his CAM boot and noticed that it has been more swollen and painful. Denies any fever although he was noted to have one on 6/17 and had negative CXR and negative bilateral lower extremity doppler ultrasound. No nausea/vomiting/diarrhea. No abdominal pain. No pain to his other incisions  from recent surgical sites.   He had leukocytosis of 13.3. Stable hgb of 9.2. Na of 128, K of 3.2. Glucose of 121. AST of 47, ALT of 75, and alkaline phosphatase of 185 which is stable from prior.   CT of the left LE shows only soft tissue swelling but no osteomyelitis or changes to recent surgical site.  **Interim History Continues to spike fever so we will give him antipyretics and add ibuprofen and spiked a temperature early this morning of 102.7 we will continue IV ceftriaxone for now as his lower extremity swelling and warmth is improving. Will also start IVF and he is getting normal saline at 75 mils per hour.  We will change enoxaparin to 30 mg subcu every 12 per trauma recommendations at discharge.  Psychiatry reevaluated and cleared the patient and stated that he did not need inpatient psychiatric services when he is medically cleared.  IVC was rescinded and when the IVC rescinded patient wanted to leave.  I told him that he is not medically ready to leave given that he continues spike temperatures in the we needed to continue to treat his infection, However he states that he does not want to be in the hospital anymore and he is of sound mind to make his decisions now that he is cleared by psychiatry.  He then promptly signed out [place] understanding the risks of worsening, decompensation and even death and states that he will just come back to the hospital if he worsens.   Discharge Diagnoses:  Principal Problem:   Cellulitis Active Problems:   Acetabulum fracture, right (HCC)   Type III open displaced segmental fracture of shaft  of left tibia   Suicidal ideation   Fracture of metacarpal base of left hand, closed, sequela   Anemia   Transaminitis  Fever likely secondary to cellulitis of the left LE s/p recent left tibia fracture and intramedullary nailing, persistent slightly -Continue IV Rocephin -Had negative COVID test 6/18 -Continues to have fevers -WBC is  improved and went from 13.3 is now 5.7 -Continue with antipyretics with acetaminophen as well as ibuprofen -Remains a little tachycardic -Blood cultures x2 showed no growth to date at 2 days -His chest x-ray showed no active cardiopulmonary disease on 01/10/2020 -Continue to monitor temperature curve as well as WBCs; needs to spike temperatures and WBC is trending down-we will check a procalcitonin level in the morning as well as a lactic acid level -If he continues to spike temperatures we will obtain a CT of the chest, CT of the abdomen pelvis tomorrow -Continue Gentle IV fluid hydration with normal saline at 75 mL's per hour -Patient has left the hospital and signed out AGAINST MEDICAL ADVICE despite my urging him to remain hospitalized and medically treated for his cellulitis given that he continues to spike temperatures.  Patient understands the risks and he is of sound mind when he made his decision.  His IVC has been rescinded and patient is rescinded patient signed out against medical questions and wanted to go home  Normocytic Anemia -Stable. Pt required 2 units pRBC during last admission from trauma -had Right pelvic hematoma w/ extravasation -Hemoglobin/hematocrit went from 9.2/27.2 -> 8.4/26.4 -> 7.9/23.8 yesterday and likely could be dilutional drop but will need to monitor for active signs and symptoms of bleeding -May need to repeat a CT scan of the abdomen pelvis given his history of right pelvic hematoma with extravasation -Continue to monitor for signs and symptoms of bleeding; currently no overt bleeding noted -Repeat CBC within 1 week as he has no sign against medical vice  Transaminitis/Abnormal LFTs -This has been persistent but improving. Unclear if related to trauma. -AST yesterday was 48 and ALT was 54; monitor repeat but he signed out AGAINST MEDICAL ADVICE -Continue to monitor and trend and repeat hepatic function panel within 1 week  Recent R acetabular  FX -Initialskeletaltractionper Dr. Nelson Chimes ORIF acetabulum 5/13 by Dr. Jena Gauss. NWB until 6 weeks post op (01/15/20).  -He signed out AMA  Recent L open tibia FX -S/P I&D, IMN by Dr. Jena Gauss 5/12. Needs to wear splint to LLE as he cannot actively dorsiflex ankle. NWB. -WBAT for transfers only. -He signed out AMA  Recent Displaced fracture of 1stleft metacarpal -Underwent internal fixation by Dr. Melvyn Novas on 01/05/20.  -He was placed in a splint. He should remain in this for 2 weeks and then follow up in the office -He signed out AMA  Suicide Attempt/Mood Disorder/IVC admit -Admitted from behavior health as IVC.  -Denies any Suicidal or homicidal ideations at this time.  -C/w Sitter at all times. -Patient had multiple outbursts during his recent hospitalist stay that required multiple psych evaluation and medication adjustments.  -Low concerns for NMS since he does not have AMS, rigidity and have cellulitis as source of fever. -ContinueConazepam, Thorazine,Depakote and PRN ativan for now for med list in behavior health.  Psychiatry consultation has been placed cleared the patient and they do not feel that he needs to go back to inpatient psychiatric hospitalization Patient signed out AGAINST MEDICAL ADVICE  Hyperglycemia -Wanted to Hemoglobin A1c in the AM but he signed out AGAINST MEDICAL ADVICE -Patient's blood sugar  on admission was 121 and repeat blood sugar on CMP today was 105 -Continue to Monitor blood sugars carefully and if necessary will place on sensitive NovoLog sliding scale insulin AC -Signed out AGAINST MEDICAL ADVICE  Hyponatremia/Hypochloremia -Patient sodium was again 134 and chloride level is 100 and improved yesterday however he did not have labs checked this morning and refused and signed out -Start normal saline at 75 MLS per hour and was continued while he was hospitalized -Continue monitor and trend and repeat in 1 week as he signed out  AGAINST MEDICAL ADVICE  Discharge Instructions   Allergies as of 01/13/2020   No Known Allergies     Medication List    STOP taking these medications   chlorproMAZINE 25 MG/ML injection Commonly known as: THORAZINE     TAKE these medications   acetaminophen 500 MG tablet Commonly known as: TYLENOL Take 2 tablets (1,000 mg total) by mouth every 6 (six) hours as needed.   amLODipine 10 MG tablet Commonly known as: NORVASC Take 1 tablet (10 mg total) by mouth daily.   bacitracin ointment Apply topically 2 (two) times daily.   benztropine 0.5 MG tablet Commonly known as: COGENTIN Take 1 tablet (0.5 mg total) by mouth 2 (two) times daily.   chlorhexidine 0.12 % solution Commonly known as: PERIDEX 15 mLs by Mouth Rinse route 2 (two) times daily.   clonazePAM 0.5 MG disintegrating tablet Commonly known as: KLONOPIN Take 1 tablet (0.5 mg total) by mouth 2 (two) times daily.   diphenhydrAMINE 25 mg capsule Commonly known as: BENADRYL Take 1 capsule (25 mg total) by mouth at bedtime as needed for sleep.   docusate 50 MG/5ML liquid Commonly known as: COLACE Take 10 mLs (100 mg total) by mouth 2 (two) times daily.   enoxaparin 30 MG/0.3ML injection Commonly known as: LOVENOX Inject 0.3 mLs (30 mg total) into the skin every 12 (twelve) hours.   hydrochlorothiazide 50 MG tablet Commonly known as: HYDRODIURIL Take 1 tablet (50 mg total) by mouth daily.   LORazepam 2 MG tablet Commonly known as: ATIVAN Take 1 tablet (2 mg total) by mouth every 6 (six) hours as needed for anxiety. What changed: Another medication with the same name was removed. Continue taking this medication, and follow the directions you see here.   methocarbamol 500 MG tablet Commonly known as: ROBAXIN Take 2 tablets (1,000 mg total) by mouth every 8 (eight) hours as needed for muscle spasms.   mouth rinse Liqd solution 15 mLs by Mouth Rinse route 2 times daily at 12 noon and 4 pm.   ondansetron  4 MG tablet Commonly known as: ZOFRAN Take 1 tablet (4 mg total) by mouth every 4 (four) hours as needed for nausea.   oxyCODONE 5 MG/5ML solution Commonly known as: ROXICODONE Take 5-10 mLs (5-10 mg total) by mouth every 4 (four) hours as needed for moderate pain (  for moderate pain,  for severe pain).   polyethylene glycol 17 g packet Commonly known as: MIRALAX / GLYCOLAX Take 17 g by mouth 2 (two) times daily.   valproic acid 250 MG/5ML solution Commonly known as: DEPAKENE Take 10 mLs (500 mg total) by mouth 2 (two) times daily.   Vitamin D3 25 MCG tablet Commonly known as: Vitamin D Take 2 tablets (2,000 Units total) by mouth daily.       No Known Allergies  Consultations:  Psychiatry  Procedures/Studies: DG Chest 2 View  Result Date: 01/10/2020 CLINICAL DATA:  29 year old male with suspected  sepsis. EXAM: CHEST - 2 VIEW COMPARISON:  Chest radiograph dated 01/08/2020. FINDINGS: The heart size and mediastinal contours are within normal limits. Both lungs are clear. The visualized skeletal structures are unremarkable. IMPRESSION: No active cardiopulmonary disease. Electronically Signed   By: Elgie Collard M.D.   On: 01/10/2020 18:35   CT PELVIS WO CONTRAST  Result Date: 12/16/2019 CLINICAL DATA:  Extensive pelvic fractures. Evaluate for bladder leak. EXAM: CT PELVIS WITHOUT CONTRAST TECHNIQUE: Multidetector CT imaging of the pelvis was performed following the standard protocol without intravenous contrast. COMPARISON:  CT cystogram 12/03/2019 FINDINGS: Urinary Tract: Bladder is distended with contrast material administered via the existing Foley catheter. As on the prior study, there is trace contrast extravasation into the periurethral prostatic soft tissues posteriorly and slightly to the left (see axial image 93/series 3 and sagittal image 99 of series 14). Bowel: No evidence for bowel dilatation within the visualized pelvis. Vascular/Lymphatic: Limited assessment  on this noncontrast exam. Reproductive: Prostate gland poorly visualized on noncontrast imaging. Other: Pelvic hematoma seen on prior study has decreased in the interval. There is gas in the soft tissues of the anterior abdominal wall compatible with recent surgery. Musculoskeletal: Extensive fixation hardware noted in the bony anatomy of the right pelvis. IMPRESSION: Persistent trace extravasation of contrast material posterior and to the left of the prosthetic urethra. Electronically Signed   By: Kennith Center M.D.   On: 12/16/2019 09:19   CT TIBIA FIBULA LEFT W CONTRAST  Result Date: 01/10/2020 CLINICAL DATA:  Right ankle in calf pain and swelling. Fever. Previous tibial fixation EXAM: CT OF THE LOWER RIGHT EXTREMITY WITH CONTRAST TECHNIQUE: Multidetector CT imaging of the lower right extremity was performed according to the standard protocol following intravenous contrast administration. COMPARISON:  X-ray 12/03/2019, 12/18/2019 CONTRAST:  OMNIPAQUE IOHEXOL 300 MG/ML  SOLN FINDINGS: Bones/Joint/Cartilage Redemonstration of comminuted proximal and distal tibial diaphyseal fractures status post ORIF with long intramedullary rod and proximal and distal interlocking screws. Bone graft material is noted at the mid to distal tibial diaphysis. There is periosteal new bone formation associated with the fracture sites, most prevalent at the proximal fracture location. Hardware appears intact. Osseous alignment is unchanged and remains near anatomic. No new fracture. No areas of cortical destruction are identified. Comminuted, mildly displaced mid to distal fibular diaphyseal fracture with 5.0 cm butterfly fragment which is slightly posteriorly displaced. There is also periosteal new bone formation at the fibular fracture site. No change in alignment from previous radiographs. Small amount of periosteal new bone formation along the anteromedial margin of the fibular head, which may represent a healing  nondisplaced fracture or mechanical irritation from proximal-most tibial screw (series 3, image 56). No areas of cortical destruction are identified. Alignment at the knee and ankle are maintained. Joint spaces are preserved. No tibiotalar joint effusion or ankle joint effusion. Ligaments Suboptimally assessed by CT. Muscles and Tendons No well-defined intramuscular fluid collection. The tendinous structures appear intact within the limitations of CT. Soft tissues There is circumferential soft tissue edema at the level of the distal lower leg and ankle. No well-defined fluid collection. No soft tissue gas. No deep soft tissue ulceration. IMPRESSION: 1. Circumferential soft tissue edema at the level of the distal lower leg and ankle. No well-defined fluid collection or soft tissue gas. 2. No evidence to suggest osteomyelitis. 3. Redemonstration of comminuted proximal and distal tibial diaphyseal fractures status post ORIF with long intramedullary rod and proximal and distal interlocking screws. Hardware appears intact. Osseous alignment is  unchanged and remains near-anatomic. 4. Small amount of periosteal new bone formation along the anteromedial margin of the fibular head, which may represent a healing nondisplaced fracture or mechanical irritation from the proximal-most tibial screw. Electronically Signed   By: Duanne GuessNicholas  Plundo D.O.   On: 01/10/2020 19:09   DG Pelvis Comp Min 3V  Result Date: 01/03/2020 CLINICAL DATA:  Right hip pain EXAM: JUDET PELVIS - 3+ VIEW COMPARISON:  12/12/2019 FINDINGS: Postsurgical changes are again noted on the right stable in appearance from the prior exam. Stable right inferior pubic ramus fracture is noted. No new abnormality is seen. IMPRESSION: Stable right inferior pubic ramus fracture stable postoperative and posttraumatic changes. No acute abnormality is noted. Electronically Signed   By: Alcide CleverMark  Lukens M.D.   On: 01/03/2020 15:08   DG CHEST PORT 1 VIEW  Result Date:  01/08/2020 CLINICAL DATA:  Smoker. EXAM: PORTABLE CHEST 1 VIEW COMPARISON:  12/06/2019 FINDINGS: Lordotic technique is demonstrated. Lungs are adequately inflated and otherwise clear. Cardiomediastinal silhouette and remainder of the exam is unchanged. IMPRESSION: No active disease. Electronically Signed   By: Elberta Fortisaniel  Boyle M.D.   On: 01/08/2020 11:08   DG Tibia/Fibula Left Port  Result Date: 12/18/2019 CLINICAL DATA:  Left tib fib fracture status post trauma EXAM: PORTABLE LEFT TIBIA AND FIBULA - 2 VIEW COMPARISON:  12/03/2019 FINDINGS: Tibial IM nail and screw device appears stable in position. The fracture fragments remain in near anatomic alignment. The fracture lines remain distinct. Comminuted fracture of the mid to distal shaft of the fibula is stable from previous exam. IMPRESSION: Unchanged appearance of the left lower leg status post ORIF of comminuted tibial and fibular fractures. Fracture fragments remain in near anatomic alignment. Electronically Signed   By: Signa Kellaylor  Stroud M.D.   On: 12/18/2019 09:59   DG Hand Complete Left  Result Date: 12/24/2019 CLINICAL DATA:  Proximal left thumb pain. Possible injury. The patient is a poor historian. Initial encounter. EXAM: LEFT HAND - COMPLETE 3+ VIEW COMPARISON:  None. FINDINGS: The patient has a fracture through the base of the first metacarpal of the thumb on the ulnar side. The fracture fragment measures 1.2 cm transverse by 1.6 cm at the articular surface of the first [facility] joint by 0.7 cm craniocaudal. The fragment is medially displaced 1 shaft width. There is callus formation about the fracture consistent with subacute injury. IMPRESSION: Subacute displaced fracture through the base of the first metacarpal involves the articular surface and is described above. Electronically Signed   By: Drusilla Kannerhomas  Dalessio M.D.   On: 12/24/2019 13:34   DG MINI C-ARM IMAGE ONLY  Result Date: 01/05/2020 There is no interpretation for this exam.  This order is for  images obtained during a surgical procedure.  Please See "Surgeries" Tab for more information regarding the procedure.   VAS US LOWER EXTREMITY VENOUS (DVT)  Result Date: 01/09/2020  Lower Venous DVTStudy Indications: Edema.  Comparison Study: 12/12/19 negative Performing Technologist: Jeb LeveringJill Parker RDMS, RVT  Examination Guidelines: A complete evaluation includes B-mode imaging, spectral Doppler, color Doppler, and power Doppler as needed of all accessible portions of each vessel. Bilateral testing is considered an integral part of a complete examination. Limited examinations for reoccurring indications may be performed as noted. The reflux portion of the exam is performed with the patient in reverse Trendelenburg.  +---------+---------------+---------+-----------+----------+--------------+ RIGHT    CompressibilityPhasicitySpontaneityPropertiesThrombus Aging +---------+---------------+---------+-----------+----------+--------------+ CFV      Full           Yes  Yes                                 +---------+---------------+---------+-----------+----------+--------------+ SFJ      Full                                                        +---------+---------------+---------+-----------+----------+--------------+ FV Prox  Full                                                        +---------+---------------+---------+-----------+----------+--------------+ FV Mid   Full                                                        +---------+---------------+---------+-----------+----------+--------------+ FV DistalFull                                                        +---------+---------------+---------+-----------+----------+--------------+ PFV      Full                                                        +---------+---------------+---------+-----------+----------+--------------+ POP      Full           Yes      Yes                                  +---------+---------------+---------+-----------+----------+--------------+ PTV      Full                                                        +---------+---------------+---------+-----------+----------+--------------+ PERO     Full                                                        +---------+---------------+---------+-----------+----------+--------------+   +---------+---------------+---------+-----------+----------+--------------+ LEFT     CompressibilityPhasicitySpontaneityPropertiesThrombus Aging +---------+---------------+---------+-----------+----------+--------------+ CFV      Full           Yes      Yes                                 +---------+---------------+---------+-----------+----------+--------------+ SFJ      Full                                                        +---------+---------------+---------+-----------+----------+--------------+  FV Prox  Full                                                        +---------+---------------+---------+-----------+----------+--------------+ FV Mid   Full                                                        +---------+---------------+---------+-----------+----------+--------------+ FV DistalFull                                                        +---------+---------------+---------+-----------+----------+--------------+ PFV      Full                                                        +---------+---------------+---------+-----------+----------+--------------+ POP      Full           Yes      Yes                                 +---------+---------------+---------+-----------+----------+--------------+ PTV      Full                                                        +---------+---------------+---------+-----------+----------+--------------+ PERO     Full                                                         +---------+---------------+---------+-----------+----------+--------------+     Summary: BILATERAL: - No evidence of deep vein thrombosis seen in the lower extremities, bilaterally. -No evidence of popliteal cyst, bilaterally.  LEFT: - fluid filled area noted focally in mid calf, etiology unknown  *See table(s) above for measurements and observations. Electronically signed by Lemar Livings MD on 01/09/2020 at 4:31:06 PM.    Final     Subjective: Seen and examined at bedside and states that he wanted to go home.  At home that he is not medically ready given that he continues to spike temperatures and he had a temperature this morning.  His IVC was rescinded given that psychiatry felt that he was stable from a psychiatric standpoint.  He then probably signed out AGAINST MEDICAL ADVICE being of sound mind understanding the risks of worsening, decompensation and even possible death  Discharge Exam: Vitals:   01/13/20 0609 01/13/20 0910  BP: 130/76 134/74  Pulse: (!) 107 (!) 104  Resp: 18 18  Temp: 99.2 F (37.3 C) (!) 100.5 F (  38.1 C)  SpO2: 100% 100%   Vitals:   01/12/20 1818 01/12/20 2230 01/13/20 0609 01/13/20 0910  BP: 134/81 128/88 130/76 134/74  Pulse: (!) 117 (!) 109 (!) 107 (!) 104  Resp: Temp: (!) 100.6 F (38.1 C) 99 F (37.2 C) 99.2 F (37.3 C) (!) 100.5 F (38.1 C)  TempSrc: Oral Oral Oral Oral  SpO2: 100% 100% 100% 100%  Weight:      Height:       General: Pt is alert, awake, not in acute distress Cardiovascular: Tachycardic rate but regular rhythm, S1/S2 +, no rubs, no gallops Respiratory: Diminished bilaterally, no wheezing, no rhonchi Abdominal: Soft, NT, ND, bowel sounds + Extremities: Has 1+ lower extremity edema on the left and with some warmth   The results of significant diagnostics from this hospitalization (including imaging, microbiology, ancillary and laboratory) are listed below for reference.    Microbiology: Recent Results (from the past 240  hour(s))  SARS Coronavirus 2 by RT PCR (hospital order, performed in [facility] hospital lab) Nasopharyngeal Nasopharyngeal Swab     Status: None   Collection Time: 01/09/20  2:06 PM   Specimen: Nasopharyngeal Swab  Result Value Ref Range Status   SARS Coronavirus 2 NEGATIVE NEGATIVE Final    Comment: (NOTE) SARS-CoV-2 target nucleic acids are NOT DETECTED.  The SARS-CoV-2 RNA is generally detectable in upper and lower respiratory specimens during the acute phase of infection. The lowest concentration of SARS-CoV-2 viral copies this assay can detect is 250 copies / mL. A negative result does not preclude SARS-CoV-2 infection and should not be used as the sole basis for treatment or other patient management decisions.  A negative result may occur with improper specimen collection / handling, submission of specimen other than nasopharyngeal swab, presence of viral mutation(s) within the areas targeted by this assay, and inadequate number of viral copies (<250 copies / mL). A negative result must be combined with clinical observations, patient history, and epidemiological information.  Fact Sheet for Patients:   BoilerBrush.com.cy  Fact Sheet for Healthcare Providers: https://pope.com/  This test is not yet approved or  cleared by the [place] FDA and has been authorized for detection and/or diagnosis of SARS-CoV-2 by FDA under an Emergency Use Authorization (EUA).  This EUA will remain in effect (meaning this test can be used) for the duration of the COVID-19 declaration under Section 564(b)(1) of the Act, 21 U.S.C. section 360bbb-3(b)(1), unless the authorization is terminated or revoked sooner.  Performed at [facility], [address] [place], [place] [address]   Culture, blood (Routine x 2)     Status: None (Preliminary result)   Collection Time: 01/10/20  5:17 PM   Specimen: BLOOD  Result Value Ref Range Status    Specimen Description   Final    BLOOD RIGHT ANTECUBITAL Performed at [facility], [address] [place], [place] [address]    Special Requests   Final    BOTTLES DRAWN AEROBIC AND ANAEROBIC Blood Culture results may not be optimal due to an inadequate volume of blood received in culture bottles Performed at [facility], [address], [place], [place] [address]    Culture   Final    NO GROWTH 3 DAYS Performed at [facility], [address], [place], [place] [address]    Report Status PENDING  Incomplete  Culture, blood (Routine x 2)     Status: None (Preliminary result)  Collection Time: 01/10/20  5:22 PM   Specimen: BLOOD  Result Value Ref Range Status   Specimen Description   Final    BLOOD RIGHT HAND Performed at [facility], [address], [place], [place] [address]    Special Requests   Final    BOTTLES DRAWN AEROBIC AND ANAEROBIC Blood Culture results may not be optimal due to an excessive volume of blood received in culture bottles Performed at [facility], [address] [place], [place] [address]    Culture   Final    NO GROWTH 3 DAYS Performed at [facility], [address], [place], [place] [address]    Report Status PENDING  Incomplete   Labs: BNP (last 3 results) No results for input(s): BNP in the last 8760 hours. Basic Metabolic Panel: Recent Labs  Lab 01/07/20 0634 01/10/20 1728 01/11/20 0931 01/12/20 0403  NA 135 128* 134* 134*  K 3.9 3.2* 3.6 4.1  CL 94* 88* 95* 100  CO2 29 27 30 26   GLUCOSE 101* 121* 111* 105*  BUN 14 20 13 14   CREATININE 0.84 1.03 0.65 0.77  CALCIUM 9.4 8.4* 8.6* 8.1*  MG  --   --  2.0 1.9  PHOS  --   --  2.9 3.2   Liver Function Tests: Recent Labs  Lab 01/07/20 0634 01/10/20 1728 01/11/20 0931 01/12/20 0403  AST 44* 47* 38 48*  ALT 75* 75* 55* 54*  ALKPHOS 252* 185* 159* 135*  BILITOT 0.5 0.6 0.6 0.5  PROT 6.7 7.1  6.5 5.8*  ALBUMIN 3.4* 3.5 3.1* 2.6*   No results for input(s): LIPASE, AMYLASE in the last 168 hours. No results for input(s): AMMONIA in the last 168 hours. CBC: Recent Labs  Lab 01/07/20 0634 01/10/20 1728 01/11/20 0602 01/12/20 0403  WBC 7.2 13.3* 9.7 5.7  NEUTROABS  --  10.4*  --  3.9  HGB 10.5* 9.2* 8.4* 7.9*  HCT 32.1* 27.2* 26.4* 23.8*  MCV 89.9 88.0 90.7 87.8  PLT 259 209 192 180   Cardiac Enzymes: No results for input(s): CKTOTAL, CKMB, CKMBINDEX, TROPONINI in the last 168 hours. BNP: Invalid input(s): POCBNP CBG: No results for input(s): GLUCAP in the last 168 hours. D-Dimer No results for input(s): DDIMER in the last 72 hours. Hgb A1c No results for input(s): HGBA1C in the last 72 hours. Lipid Profile No results for input(s): CHOL, HDL, LDLCALC, TRIG, CHOLHDL, LDLDIRECT in the last 72 hours. Thyroid function studies No results for input(s): TSH, T4TOTAL, T3FREE, THYROIDAB in the last 72 hours.  Invalid input(s): FREET3 Anemia work up No results for input(s): VITAMINB12, FOLATE, FERRITIN, TIBC, IRON, RETICCTPCT in the last 72 hours. Urinalysis    Component Value Date/Time   COLORURINE STRAW (A) 01/08/2020 0923   APPEARANCEUR CLEAR 01/08/2020 0923   LABSPEC 1.008 01/08/2020 0923   PHURINE 6.0 01/08/2020 0923   GLUCOSEU NEGATIVE 01/08/2020 0923   HGBUR SMALL (A) 01/08/2020 0923   BILIRUBINUR NEGATIVE 01/08/2020 0923   KETONESUR NEGATIVE 01/08/2020 0923   PROTEINUR NEGATIVE 01/08/2020 0923   NITRITE NEGATIVE 01/08/2020 0923   LEUKOCYTESUR NEGATIVE 01/08/2020 0923   Sepsis Labs Invalid input(s): PROCALCITONIN,  WBC,  LACTICIDVEN Microbiology Recent Results (from the past 240 hour(s))  SARS Coronavirus 2 by RT PCR (hospital order, performed in [facility] hospital lab) Nasopharyngeal Nasopharyngeal Swab     Status: None   Collection Time: 01/09/20  2:06 PM   Specimen: Nasopharyngeal Swab  Result Value Ref Range  Status   SARS Coronavirus 2 NEGATIVE  NEGATIVE Final    Comment: (NOTE) SARS-CoV-2 target nucleic acids are NOT DETECTED.  The SARS-CoV-2 RNA is generally detectable in upper and lower respiratory specimens during the acute phase of infection. The lowest concentration of SARS-CoV-2 viral copies this assay can detect is 250 copies / mL. A negative result does not preclude SARS-CoV-2 infection and should not be used as the sole basis for treatment or other patient management decisions.  A negative result may occur with improper specimen collection / handling, submission of specimen other than nasopharyngeal swab, presence of viral mutation(s) within the areas targeted by this assay, and inadequate number of viral copies (<250 copies / mL). A negative result must be combined with clinical observations, patient history, and epidemiological information.  Fact Sheet for Patients:   BoilerBrush.com.cy  Fact Sheet for Healthcare Providers: https://pope.com/  This test is not yet approved or  cleared by the [place] FDA and has been authorized for detection and/or diagnosis of SARS-CoV-2 by FDA under an Emergency Use Authorization (EUA).  This EUA will remain in effect (meaning this test can be used) for the duration of the COVID-19 declaration under Section 564(b)(1) of the Act, 21 U.S.C. section 360bbb-3(b)(1), unless the authorization is terminated or revoked sooner.  Performed at [facility], [address] [place], [place] [address]   Culture, blood (Routine x 2)     Status: None (Preliminary result)   Collection Time: 01/10/20  5:17 PM   Specimen: BLOOD  Result Value Ref Range Status   Specimen Description   Final    BLOOD RIGHT ANTECUBITAL Performed at [facility], [address] [place], [place] [address]    Special Requests   Final    BOTTLES DRAWN AEROBIC AND ANAEROBIC Blood Culture results may not be optimal due to an inadequate  volume of blood received in culture bottles Performed at [facility], [address], [place], [place] [address]    Culture   Final    NO GROWTH 3 DAYS Performed at [facility], [address] [place], [place] [address]    Report Status PENDING  Incomplete  Culture, blood (Routine x 2)     Status: None (Preliminary result)   Collection Time: 01/10/20  5:22 PM   Specimen: BLOOD  Result Value Ref Range Status   Specimen Description   Final    BLOOD RIGHT HAND Performed at [facility], [address], [place], [place] [address]    Special Requests   Final    BOTTLES DRAWN AEROBIC AND ANAEROBIC Blood Culture results may not be optimal due to an excessive volume of blood received in culture bottles Performed at [facility], [address] [place], [place] [address]    Culture   Final    NO GROWTH 3 DAYS Performed at [facility], [address], [place], [place] [address]    Report Status PENDING  Incomplete   Time coordinating discharge: 25 minutes  SIGNED:  Merlene Laughter, DO [facility] 01/13/2020, 1:39 PM Pager is on AMION  If 7PM-7AM, please contact night-coverage [email or web address]

## 2020-01-13 NOTE — TOC Transition Note (Signed)
Transition of Care [facility]) - CM/SW Discharge Note   Patient Details  Name: Paul Bender MRN: [id number] Date of Birth: February 22, 1991  Transition of Care [facility]) CM/SW Contact:  Amada Jupiter, LCSW Phone Number: 01/13/2020, 10:08 AM   Clinical Narrative:   Alerted by psychiatry services yesterday that pt was cleared and no longer requiring inpatient psychiatric placement.  IVC rescinded.  Orders for TOC to set pt up with [facility] for virtual follow up.  Let VM for Delaney Meigs with [facility] yesterday afternoon and again this morning but no response yet.  Pt has now left AMA.  Have left a VM asking that [facility] follow up with pt directly on appointment information.    Final next level of care: Against Medical Advice     Patient Goals and CMS Choice        Discharge Placement                       Discharge Plan and Services                                     Social Determinants of Health (SDOH) Interventions     Readmission Risk Interventions No flowsheet data found.

## 2020-01-14 ENCOUNTER — Encounter (HOSPITAL_COMMUNITY): Payer: Self-pay | Admitting: Emergency Medicine

## 2020-01-14 ENCOUNTER — Telehealth (HOSPITAL_COMMUNITY): Payer: Self-pay | Admitting: Licensed Clinical Social Worker

## 2020-01-14 LAB — HEMOGLOBIN A1C
Hgb A1c MFr Bld: 5.2 % (ref 4.8–5.6)
Mean Plasma Glucose: 103 mg/dL

## 2020-01-15 LAB — CULTURE, BLOOD (ROUTINE X 2)
Culture: NO GROWTH
Culture: NO GROWTH
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
## 2020-02-17 ENCOUNTER — Ambulatory Visit: Payer: Self-pay | Admitting: Student

## 2020-02-17 DIAGNOSIS — S82262S Displaced segmental fracture of shaft of left tibia, sequela: Secondary | ICD-10-CM

## 2020-02-24 ENCOUNTER — Ambulatory Visit (HOSPITAL_COMMUNITY): Payer: No Typology Code available for payment source | Admitting: Psychiatry

## 2020-03-02 NOTE — Progress Notes (Signed)
Patient called and informed nurse that he tested positive for Covid on 8/5 and that he has sinus congestion, loss of smell, and diarrhea.  Katie at Dr. Luvenia Starch office is aware.

## 2020-03-03 ENCOUNTER — Telehealth (HOSPITAL_COMMUNITY): Payer: No Typology Code available for payment source

## 2020-03-03 ENCOUNTER — Ambulatory Visit (HOSPITAL_COMMUNITY): Payer: No Typology Code available for payment source | Admitting: Psychiatry

## 2020-03-03 DIAGNOSIS — S82262C Displaced segmental fracture of shaft of left tibia, initial encounter for open fracture type IIIA, IIIB, or IIIC: Secondary | ICD-10-CM | POA: Insufficient documentation

## 2020-03-03 DIAGNOSIS — S82262S Displaced segmental fracture of shaft of left tibia, sequela: Secondary | ICD-10-CM
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
## 2020-03-17 ENCOUNTER — Inpatient Hospital Stay (HOSPITAL_COMMUNITY): Payer: No Typology Code available for payment source | Admitting: Anesthesiology

## 2020-03-17 ENCOUNTER — Encounter (HOSPITAL_COMMUNITY)
Admission: RE | Disposition: A | Discharge status: Home / Self Care | Payer: Self-pay | Source: Home / Self Care | Attending: Student

## 2020-03-17 ENCOUNTER — Telehealth (HOSPITAL_COMMUNITY): Payer: No Typology Code available for payment source

## 2020-03-17 ENCOUNTER — Inpatient Hospital Stay (HOSPITAL_COMMUNITY): Payer: No Typology Code available for payment source

## 2020-03-17 ENCOUNTER — Other Ambulatory Visit: Payer: Self-pay

## 2020-03-17 ENCOUNTER — Ambulatory Visit (HOSPITAL_COMMUNITY)
Admission: RE | Admit: 2020-03-17 | Discharge: 2020-03-17 | Disposition: A | Discharge status: Home / Self Care | Payer: No Typology Code available for payment source | Attending: Student | Admitting: Student

## 2020-03-17 ENCOUNTER — Encounter (HOSPITAL_COMMUNITY): Payer: Self-pay | Admitting: Student

## 2020-03-17 DIAGNOSIS — S82202K Unspecified fracture of shaft of left tibia, subsequent encounter for closed fracture with nonunion: Secondary | ICD-10-CM | POA: Insufficient documentation

## 2020-03-17 DIAGNOSIS — T1490XA Injury, unspecified, initial encounter: Secondary | ICD-10-CM

## 2020-03-17 DIAGNOSIS — S82262C Displaced segmental fracture of shaft of left tibia, initial encounter for open fracture type IIIA, IIIB, or IIIC: Secondary | ICD-10-CM

## 2020-03-17 DIAGNOSIS — S82262S Displaced segmental fracture of shaft of left tibia, sequela: Secondary | ICD-10-CM

## 2020-03-17 HISTORY — PX: TIBIA IM NAIL INSERTION: SHX2516

## 2020-03-17 LAB — CBC
HCT: 34.9 % — ABNORMAL LOW (ref 39.0–52.0)
Hemoglobin: 10.9 g/dL — ABNORMAL LOW (ref 13.0–17.0)
MCH: 29.4 pg (ref 26.0–34.0)
MCHC: 31.2 g/dL (ref 30.0–36.0)
MCV: 94.1 fL (ref 80.0–100.0)
Platelets: 233 10*3/uL (ref 150–400)
RBC: 3.71 MIL/uL — ABNORMAL LOW (ref 4.22–5.81)
RDW: 16.1 % — ABNORMAL HIGH (ref 11.5–15.5)
WBC: 6.2 10*3/uL (ref 4.0–10.5)
nRBC: 0 % (ref 0.0–0.2)

## 2020-03-17 SURGERY — INSERTION, INTRAMEDULLARY ROD, TIBIA
Anesthesia: Regional | Laterality: Left

## 2020-03-17 MED ORDER — PROPOFOL 10 MG/ML IV BOLUS
INTRAVENOUS | Status: AC
  Filled 2020-03-17: qty 20

## 2020-03-17 MED ORDER — PROPOFOL 10 MG/ML IV BOLUS
INTRAVENOUS | Status: DC | PRN
  Administered 2020-03-17: 20 mg via INTRAVENOUS
  Administered 2020-03-17: 50 mg via INTRAVENOUS
  Administered 2020-03-17: 200 mg via INTRAVENOUS
  Administered 2020-03-17: 20 mg via INTRAVENOUS

## 2020-03-17 MED ORDER — CHLORHEXIDINE GLUCONATE 0.12 % MT SOLN
15.0000 mL | Freq: Once | OROMUCOSAL | Status: AC
  Administered 2020-03-17: 15 mL via OROMUCOSAL
  Filled 2020-03-17: qty 15

## 2020-03-17 MED ORDER — LACTATED RINGERS IV SOLN: INTRAVENOUS | Status: DC

## 2020-03-17 MED ORDER — DEXAMETHASONE SODIUM PHOSPHATE 10 MG/ML IJ SOLN
INTRAMUSCULAR | Status: DC | PRN
  Administered 2020-03-17: 4 mg via INTRAVENOUS

## 2020-03-17 MED ORDER — SODIUM CHLORIDE 0.9 % IR SOLN
Status: DC | PRN
  Administered 2020-03-17: 3000 mL

## 2020-03-17 MED ORDER — DEXAMETHASONE SODIUM PHOSPHATE 10 MG/ML IJ SOLN
INTRAMUSCULAR | Status: AC
  Filled 2020-03-17: qty 1

## 2020-03-17 MED ORDER — DEXAMETHASONE SODIUM PHOSPHATE 10 MG/ML IJ SOLN
INTRAMUSCULAR | Status: DC | PRN
  Administered 2020-03-17: 10 mg

## 2020-03-17 MED ORDER — FENTANYL CITRATE (PF) 100 MCG/2ML IJ SOLN
INTRAMUSCULAR | Status: DC | PRN
  Administered 2020-03-17 (×5): 50 ug via INTRAVENOUS

## 2020-03-17 MED ORDER — ORAL CARE MOUTH RINSE: 15.0000 mL | Freq: Once | OROMUCOSAL | Status: AC

## 2020-03-17 MED ORDER — ONDANSETRON HCL 4 MG/2ML IJ SOLN
INTRAMUSCULAR | Status: AC
  Filled 2020-03-17: qty 2

## 2020-03-17 MED ORDER — MIDAZOLAM HCL 5 MG/5ML IJ SOLN
INTRAMUSCULAR | Status: DC | PRN
  Administered 2020-03-17: 2 mg via INTRAVENOUS

## 2020-03-17 MED ORDER — OXYCODONE HCL 5 MG PO TABS: 5.0000 mg | ORAL_TABLET | ORAL | 0 refills | Status: DC | PRN

## 2020-03-17 MED ORDER — CEFAZOLIN SODIUM-DEXTROSE 2-4 GM/100ML-% IV SOLN
2.0000 g | INTRAVENOUS | Status: AC
  Administered 2020-03-17: 2 g via INTRAVENOUS
  Filled 2020-03-17: qty 100

## 2020-03-17 MED ORDER — ROPIVACAINE HCL 5 MG/ML IJ SOLN
INTRAMUSCULAR | Status: DC | PRN
  Administered 2020-03-17: 40 mL via PERINEURAL

## 2020-03-17 MED ORDER — LIDOCAINE 2% (20 MG/ML) 5 ML SYRINGE
INTRAMUSCULAR | Status: DC | PRN
  Administered 2020-03-17: 20 mg via INTRAVENOUS

## 2020-03-17 MED ORDER — ASPIRIN EC 325 MG PO TBEC: 325.0000 mg | DELAYED_RELEASE_TABLET | Freq: Every day | ORAL | 0 refills | Status: AC

## 2020-03-17 MED ORDER — MIDAZOLAM HCL 2 MG/2ML IJ SOLN
INTRAMUSCULAR | Status: AC
  Filled 2020-03-17: qty 2

## 2020-03-17 MED ORDER — ACETAMINOPHEN 500 MG PO TABS
1000.0000 mg | ORAL_TABLET | Freq: Once | ORAL | Status: AC
  Administered 2020-03-17: 1000 mg via ORAL
  Filled 2020-03-17: qty 2

## 2020-03-17 MED ORDER — ONDANSETRON HCL 4 MG/2ML IJ SOLN
INTRAMUSCULAR | Status: DC | PRN
  Administered 2020-03-17: 4 mg via INTRAVENOUS

## 2020-03-17 MED ORDER — FENTANYL CITRATE (PF) 250 MCG/5ML IJ SOLN
INTRAMUSCULAR | Status: AC
  Filled 2020-03-17: qty 5

## 2020-03-17 MED ORDER — VANCOMYCIN HCL 1000 MG IV SOLR
INTRAVENOUS | Status: DC | PRN
  Administered 2020-03-17: 1 g

## 2020-03-17 MED ORDER — 0.9 % SODIUM CHLORIDE (POUR BTL) OPTIME
TOPICAL | Status: DC | PRN
  Administered 2020-03-17: 1000 mL

## 2020-03-17 MED ORDER — VANCOMYCIN HCL 1000 MG IV SOLR
INTRAVENOUS | Status: AC
  Filled 2020-03-17: qty 1000

## 2020-03-17 SURGICAL SUPPLY — 56 items
BLADE SURG 10 STRL SS (BLADE) ×4 IMPLANT
BNDG COHESIVE 4X5 TAN STRL (GAUZE/BANDAGES/DRESSINGS) ×2 IMPLANT
BNDG ELASTIC 4X5.8 VLCR STR LF (GAUZE/BANDAGES/DRESSINGS) ×2 IMPLANT
BNDG ELASTIC 6X5.8 VLCR STR LF (GAUZE/BANDAGES/DRESSINGS) ×2 IMPLANT
BNDG GAUZE ELAST 4 BULKY (GAUZE/BANDAGES/DRESSINGS) ×2 IMPLANT
BONE CANC CHIPS 20CC PCAN1/4 (Bone Implant) ×2 IMPLANT
BRUSH SCRUB EZ PLAIN DRY (MISCELLANEOUS) ×4 IMPLANT
CHIPS CANC BONE 20CC PCAN1/4 (Bone Implant) ×1 IMPLANT
CHLORAPREP W/TINT 26 (MISCELLANEOUS) ×2 IMPLANT
COVER SURGICAL LIGHT HANDLE (MISCELLANEOUS) ×4 IMPLANT
COVER WAND RF STERILE (DRAPES) ×2 IMPLANT
DRAPE C-ARM 42X72 X-RAY (DRAPES) ×2 IMPLANT
DRAPE C-ARMOR (DRAPES) ×2 IMPLANT
DRAPE HALF SHEET 40X57 (DRAPES) ×4 IMPLANT
DRAPE IMP U-DRAPE 54X76 (DRAPES) ×4 IMPLANT
DRAPE INCISE IOBAN 66X45 STRL (DRAPES) IMPLANT
DRAPE ORTHO SPLIT 77X108 STRL (DRAPES) ×2
DRAPE SURG ORHT 6 SPLT 77X108 (DRAPES) ×2 IMPLANT
DRAPE U-SHAPE 47X51 STRL (DRAPES) ×2 IMPLANT
DRSG ADAPTIC 3X8 NADH LF (GAUZE/BANDAGES/DRESSINGS) ×2 IMPLANT
DRSG MEPITEL 4X7.2 (GAUZE/BANDAGES/DRESSINGS) ×2 IMPLANT
ELECT REM PT RETURN 9FT ADLT (ELECTROSURGICAL) ×2
ELECTRODE REM PT RTRN 9FT ADLT (ELECTROSURGICAL) ×1 IMPLANT
GAUZE SPONGE 4X4 12PLY STRL (GAUZE/BANDAGES/DRESSINGS) ×2 IMPLANT
GLOVE BIO SURGEON STRL SZ 6.5 (GLOVE) ×6 IMPLANT
GLOVE BIO SURGEON STRL SZ7.5 (GLOVE) ×8 IMPLANT
GLOVE BIOGEL PI IND STRL 6.5 (GLOVE) ×1 IMPLANT
GLOVE BIOGEL PI IND STRL 7.5 (GLOVE) ×1 IMPLANT
GLOVE BIOGEL PI INDICATOR 6.5 (GLOVE) ×1
GLOVE BIOGEL PI INDICATOR 7.5 (GLOVE) ×1
GOWN STRL REUS W/ TWL LRG LVL3 (GOWN DISPOSABLE) ×2 IMPLANT
GOWN STRL REUS W/TWL LRG LVL3 (GOWN DISPOSABLE) ×2
GUIDEWIRE 3.2X400 (WIRE) ×2 IMPLANT
KIT BASIN OR (CUSTOM PROCEDURE TRAY) ×2 IMPLANT
KIT BONE HARVEST RIA 2 (ORTHOPEDIC DISPOSABLE SUPPLIES) ×2 IMPLANT
KIT INFUSE MEDIUM (Orthopedic Implant) ×2 IMPLANT
KIT TURNOVER KIT B (KITS) ×2 IMPLANT
PACK TOTAL JOINT (CUSTOM PROCEDURE TRAY) ×2 IMPLANT
PAD ARMBOARD 7.5X6 YLW CONV (MISCELLANEOUS) ×4 IMPLANT
PAD CAST 4YDX4 CTTN HI CHSV (CAST SUPPLIES) ×1 IMPLANT
PADDING CAST ABS 6INX4YD NS (CAST SUPPLIES) ×1
PADDING CAST ABS COTTON 6X4 NS (CAST SUPPLIES) ×1 IMPLANT
PADDING CAST COTTON 4X4 STRL (CAST SUPPLIES) ×1
REAMER HEAD RIA2 15.5 (INSTRUMENTS) ×2 IMPLANT
REAMER ROD DEEP FLUTE 2.5X950 (INSTRUMENTS) ×2 IMPLANT
STAPLER VISISTAT 35W (STAPLE) ×2 IMPLANT
SUT ETHILON 3 0 PS 1 (SUTURE) ×2 IMPLANT
SUT MNCRL AB 3-0 PS2 18 (SUTURE) ×2 IMPLANT
SUT MON AB 2-0 CT1 36 (SUTURE) ×2 IMPLANT
SUT VIC AB 0 CT1 27 (SUTURE)
SUT VIC AB 0 CT1 27XBRD ANBCTR (SUTURE) IMPLANT
SUT VIC AB 2-0 CT1 27 (SUTURE)
SUT VIC AB 2-0 CT1 TAPERPNT 27 (SUTURE) IMPLANT
TOWEL GREEN STERILE (TOWEL DISPOSABLE) ×4 IMPLANT
TOWEL GREEN STERILE FF (TOWEL DISPOSABLE) ×2 IMPLANT
YANKAUER SUCT BULB TIP NO VENT (SUCTIONS) IMPLANT

## 2020-03-17 NOTE — Interval H&P Note (Signed)
History and Physical Interval Note:  03/17/2020 7:33 AM  Paul Bender  has presented today for surgery, with the diagnosis of Left tibial nonunion.  The various methods of treatment have been discussed with the patient and family. After consideration of risks, benefits and other options for treatment, the patient has consented to  Procedure(s): REPAIR LEFT TIBIAL NONUNION WITH RIA HARVEST (Left) as a surgical intervention.  The patient's history has been reviewed, patient examined, no change in status, stable for surgery.  I have reviewed the patient's chart and labs.  Questions were answered to the patient's satisfaction.     Caryn Bee P Shanel Prazak
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
## 2020-03-17 NOTE — Anesthesia Procedure Notes (Signed)
Procedure Name: LMA Insertion Date/Time: 03/17/2020 7:40 AM Performed by: Shireen Quan, CRNA Pre-anesthesia Checklist: Patient identified, Emergency Drugs available, Suction available and Patient being monitored Patient Re-evaluated:Patient Re-evaluated prior to induction Oxygen Delivery Method: Circle System Utilized Preoxygenation: Pre-oxygenation with 100% oxygen Induction Type: IV induction Ventilation: Mask ventilation without difficulty LMA: LMA inserted LMA Size: 5.0 Number of attempts: 1 Placement Confirmation: positive ETCO2 Tube secured with: Tape Dental Injury: Teeth and Oropharynx as per pre-operative assessment

## 2020-03-17 NOTE — Transfer of Care (Signed)
Immediate Anesthesia Transfer of Care Note  Patient: Paul Bender  Procedure(s) Performed: REPAIR LEFT TIBIAL NONUNION WITH RIA HARVEST (Left )  Patient Location: PACU  Anesthesia Type:GA combined with regional for post-op pain  Level of Consciousness: awake and patient cooperative  Airway & Oxygen Therapy: Patient Spontanous Breathing and Patient connected to nasal cannula oxygen  Post-op Assessment: Report given to RN, Post -op Vital signs reviewed and stable and Patient moving all extremities  Post vital signs: Reviewed and stable  Last Vitals:  Vitals Value Taken Time  BP 163/104 03/17/20 0929  Temp    Pulse 98 03/17/20 0932  Resp 20 03/17/20 0932  SpO2 100 % 03/17/20 0932  Vitals shown include unvalidated device data.  Last Pain:  Vitals:   03/17/20 0619  TempSrc:   PainSc: 0-No pain         Complications: No complications documented.

## 2020-03-17 NOTE — Anesthesia Postprocedure Evaluation (Signed)
Anesthesia Post Note  Patient: Paul Bender  Procedure(s) Performed: REPAIR LEFT TIBIAL NONUNION WITH RIA HARVEST (Left )     Patient location during evaluation: PACU Anesthesia Type: Regional and General Level of consciousness: awake and alert, oriented and patient cooperative Pain management: pain level controlled Vital Signs Assessment: post-procedure vital signs reviewed and stable Respiratory status: spontaneous breathing, nonlabored ventilation and respiratory function stable Cardiovascular status: blood pressure returned to baseline and stable Postop Assessment: no apparent nausea or vomiting Anesthetic complications: no   No complications documented.  Last Vitals:  Vitals:   03/17/20 1000 03/17/20 1013  BP: (!) 151/88 (!) 146/90  Pulse: 90 79  Resp: (!) 25 (!) 8  Temp:  36.5 C  SpO2: 98% 100%    Last Pain:  Vitals:   03/17/20 1013  TempSrc:   PainSc: 0-No pain                 Lannie Fields

## 2020-03-17 NOTE — Discharge Instructions (Addendum)
Orthopaedic Trauma Service Discharge Instructions   General Discharge Instructions  WEIGHT BEARING STATUS:  Weightbearing as tolerated left leg  RANGE OF MOTION/ACTIVITY: Okay for ankle and knee motion as tolerated  Wound Care: Incisions can be left open to air if there is no drainage. If incision continues to have drainage, follow wound care instructions below. Okay to shower if no drainage from incisions.  DVT/PE prophylaxis: Aspirin 325 mg daily  Diet: as you were eating previously.  Can use over the counter stool softeners and bowel preparations, such as Miralax, to help with bowel movements.  Narcotics can be constipating.  Be sure to drink plenty of fluids  PAIN MEDICATION USE AND EXPECTATIONS  You have likely been given narcotic medications to help control your pain.  After a traumatic event that results in an fracture (broken bone) with or without surgery, it is ok to use narcotic pain medications to help control one's pain.  We understand that everyone responds to pain differently and each individual patient will be evaluated on a regular basis for the continued need for narcotic medications. Ideally, narcotic medication use should last no more than 6-8 weeks (coinciding with fracture healing).   As a patient it is your responsibility as well to monitor narcotic medication use and report the amount and frequency you use these medications when you come to your office visit.   We would also advise that if you are using narcotic medications, you should take a dose prior to therapy to maximize you participation.  IF YOU ARE ON NARCOTIC MEDICATIONS IT IS NOT PERMISSIBLE TO OPERATE A MOTOR VEHICLE (MOTORCYCLE/CAR/TRUCK/MOPED) OR HEAVY MACHINERY DO NOT MIX NARCOTICS WITH OTHER CNS (CENTRAL NERVOUS SYSTEM) DEPRESSANTS SUCH AS ALCOHOL   STOP SMOKING OR USING NICOTINE PRODUCTS!!!!  As discussed nicotine severely impairs your body's ability to heal surgical and traumatic wounds but also  impairs bone healing.  Wounds and bone heal by forming microscopic blood vessels (angiogenesis) and nicotine is a vasoconstrictor (essentially, shrinks blood vessels).  Therefore, if vasoconstriction occurs to these microscopic blood vessels they essentially disappear and are unable to deliver necessary nutrients to the healing tissue.  This is one modifiable factor that you can do to dramatically increase your chances of healing your injury.    (This means no smoking, no nicotine gum, patches, etc)  DO NOT USE NONSTEROIDAL ANTI-INFLAMMATORY DRUGS (NSAID'S)  Using products such as Advil (ibuprofen), Aleve (naproxen), Motrin (ibuprofen) for additional pain control during fracture healing can delay and/or prevent the healing response.  If you would like to take over the counter (OTC) medication, Tylenol (acetaminophen) is ok.  However, some narcotic medications that are given for pain control contain acetaminophen as well. Therefore, you should not exceed more than 4000 mg of tylenol in a day if you do not have liver disease.  Also note that there are may OTC medicines, such as cold medicines and allergy medicines that my contain tylenol as well.  If you have any questions about medications and/or interactions please ask your doctor/PA or your pharmacist.      ICE AND ELEVATE INJURED/OPERATIVE EXTREMITY  Using ice and elevating the injured extremity above your heart can help with swelling and pain control.  Icing in a pulsatile fashion, such as 20 minutes on and 20 minutes off, can be followed.    Do not place ice directly on skin. Make sure there is a barrier between to skin and the ice pack.    Using frozen items such as  frozen peas works well as the conform nicely to the are that needs to be iced.  USE AN ACE WRAP OR TED HOSE FOR SWELLING CONTROL  In addition to icing and elevation, Ace wraps or TED hose are used to help limit and resolve swelling.  It is recommended to use Ace wraps or TED hose until  you are informed to stop.    When using Ace Wraps start the wrapping distally (farthest away from the body) and wrap proximally (closer to the body)   Example: If you had surgery on your leg or thing and you do not have a splint on, start the ace wrap at the toes and work your way up to the thigh        If you had surgery on your upper extremity and do not have a splint on, start the ace wrap at your fingers and work your way up to the upper arm   CALL THE OFFICE WITH ANY QUESTIONS OR CONCERNS: [phone]   VISIT OUR WEBSITE FOR ADDITIONAL INFORMATION: [email or web address]     Discharge Wound Care Instructions  Do NOT apply any ointments, solutions or lotions to pin sites or surgical wounds.  These prevent needed drainage and even though solutions like hydrogen peroxide kill bacteria, they also damage cells lining the pin sites that help fight infection.  Applying lotions or ointments can keep the wounds moist and can cause them to breakdown and open up as well. This can increase the risk for infection. When in doubt call the office.  Surgical incisions should be dressed daily.  If any drainage is noted, use one layer of adaptic, then gauze, Kerlix, and an ace wrap.  Once the incision is completely dry and without drainage, it may be left open to air out.  Showering may begin 36-48 hours later.  Cleaning gently with soap and water.  Traumatic wounds should be dressed daily as well.    One layer of adaptic, gauze, Kerlix, then ace wrap.  The adaptic can be discontinued once the draining has ceased    If you have a wet to dry dressing: wet the gauze with saline the squeeze as much saline out so the gauze is moist (not soaking wet), place moistened gauze over wound, then place a dry gauze over the moist one, followed by Kerlix wrap, then ace wrap.

## 2020-03-17 NOTE — Anesthesia Procedure Notes (Signed)
Anesthesia Regional Block: Popliteal block   Pre-Anesthetic Checklist: ,, timeout performed, Correct Patient, Correct Site, Correct Laterality, Correct Procedure, Correct Position, site marked, Risks and benefits discussed,  Surgical consent,  Pre-op evaluation,  At surgeon's request and post-op pain management  Laterality: Left  Prep: Maximum Sterile Barrier Precautions used, chloraprep       Needles:  Injection technique: Single-shot  Needle Type: Echogenic Stimulator Needle     Needle Length: 9cm  Needle Gauge: 22     Additional Needles:   Procedures:,,,, ultrasound used (permanent image in chart),,,,  Narrative:  Start time: 03/17/2020 7:00 AM End time: 03/17/2020 7:05 AM Injection made incrementally with aspirations every 5 mL.  Performed by: Personally  Anesthesiologist: Lannie Fields, DO  Additional Notes: Monitors applied. No increased pain on injection. No increased resistance to injection. Injection made in 5cc increments. Good needle visualization. Patient tolerated procedure well.

## 2020-03-17 NOTE — Anesthesia Procedure Notes (Signed)
Anesthesia Regional Block: Adductor canal block   Pre-Anesthetic Checklist: ,, timeout performed, Correct Patient, Correct Site, Correct Laterality, Correct Procedure, Correct Position, site marked, Risks and benefits discussed,  Surgical consent,  Pre-op evaluation,  At surgeon's request and post-op pain management  Laterality: Left  Prep: Maximum Sterile Barrier Precautions used, chloraprep       Needles:  Injection technique: Single-shot  Needle Type: Echogenic Stimulator Needle     Needle Length: 9cm  Needle Gauge: 22     Additional Needles:   Procedures:,,,, ultrasound used (permanent image in chart),,,,  Narrative:  Start time: 03/17/2020 7:05 AM End time: 03/17/2020 7:10 AM Injection made incrementally with aspirations every 5 mL.  Performed by: Personally  Anesthesiologist: Lannie Fields, DO  Additional Notes: Monitors applied. No increased pain on injection. No increased resistance to injection. Injection made in 5cc increments. Good needle visualization. Patient tolerated procedure well.

## 2020-03-18 ENCOUNTER — Encounter (HOSPITAL_COMMUNITY): Payer: Self-pay | Admitting: Student

## 2020-04-18 ENCOUNTER — Encounter (HOSPITAL_COMMUNITY): Payer: Self-pay | Admitting: Emergency Medicine

## 2020-04-18 ENCOUNTER — Emergency Department (HOSPITAL_COMMUNITY)
Admission: EM | Admit: 2020-04-18 | Discharge: 2020-04-19 | Disposition: A | Discharge status: Home / Self Care | Payer: No Typology Code available for payment source | Attending: Emergency Medicine | Admitting: Emergency Medicine

## 2020-04-18 ENCOUNTER — Other Ambulatory Visit: Payer: Self-pay

## 2020-04-18 ENCOUNTER — Ambulatory Visit (HOSPITAL_COMMUNITY)
Admission: EM | Admit: 2020-04-18 | Discharge: 2020-04-18 | Disposition: A | Discharge status: Other Acute Inpatient Hospital | Payer: No Typology Code available for payment source | Attending: Family | Admitting: Family

## 2020-04-18 DIAGNOSIS — R45851 Suicidal ideations: Secondary | ICD-10-CM | POA: Insufficient documentation

## 2020-04-18 DIAGNOSIS — F1290 Cannabis use, unspecified, uncomplicated: Secondary | ICD-10-CM | POA: Insufficient documentation

## 2020-04-18 DIAGNOSIS — Z818 Family history of other mental and behavioral disorders: Secondary | ICD-10-CM | POA: Diagnosis not present

## 2020-04-18 DIAGNOSIS — R450 Nervousness: Secondary | ICD-10-CM | POA: Insufficient documentation

## 2020-04-18 DIAGNOSIS — F332 Major depressive disorder, recurrent severe without psychotic features: Secondary | ICD-10-CM

## 2020-04-18 DIAGNOSIS — R4587 Impulsiveness: Secondary | ICD-10-CM | POA: Diagnosis not present

## 2020-04-18 DIAGNOSIS — Z20822 Contact with and (suspected) exposure to covid-19: Secondary | ICD-10-CM | POA: Insufficient documentation

## 2020-04-18 DIAGNOSIS — Z046 Encounter for general psychiatric examination, requested by authority: Secondary | ICD-10-CM | POA: Diagnosis not present

## 2020-04-18 DIAGNOSIS — F333 Major depressive disorder, recurrent, severe with psychotic symptoms: Secondary | ICD-10-CM | POA: Insufficient documentation

## 2020-04-18 DIAGNOSIS — F29 Unspecified psychosis not due to a substance or known physiological condition: Secondary | ICD-10-CM

## 2020-04-18 DIAGNOSIS — Z915 Personal history of self-harm: Secondary | ICD-10-CM | POA: Insufficient documentation

## 2020-04-18 DIAGNOSIS — I82220 Acute embolism and thrombosis of inferior vena cava: Secondary | ICD-10-CM | POA: Diagnosis not present

## 2020-04-18 DIAGNOSIS — Z008 Encounter for other general examination: Secondary | ICD-10-CM

## 2020-04-18 DIAGNOSIS — F419 Anxiety disorder, unspecified: Secondary | ICD-10-CM | POA: Insufficient documentation

## 2020-04-18 DIAGNOSIS — G4700 Insomnia, unspecified: Secondary | ICD-10-CM | POA: Diagnosis not present

## 2020-04-18 LAB — CBC
HCT: 40.3 % (ref 39.0–52.0)
Hemoglobin: 12.9 g/dL — ABNORMAL LOW (ref 13.0–17.0)
MCH: 30.8 pg (ref 26.0–34.0)
MCHC: 32 g/dL (ref 30.0–36.0)
MCV: 96.2 fL (ref 80.0–100.0)
Platelets: 203 10*3/uL (ref 150–400)
RBC: 4.19 MIL/uL — ABNORMAL LOW (ref 4.22–5.81)
RDW: 16.9 % — ABNORMAL HIGH (ref 11.5–15.5)
WBC: 9.5 10*3/uL (ref 4.0–10.5)
nRBC: 0 % (ref 0.0–0.2)

## 2020-04-18 LAB — COMPREHENSIVE METABOLIC PANEL
ALT: 11 U/L (ref 0–44)
AST: 15 U/L (ref 15–41)
Albumin: 4.1 g/dL (ref 3.5–5.0)
Alkaline Phosphatase: 77 U/L (ref 38–126)
Anion gap: 14 (ref 5–15)
BUN: 5 mg/dL — ABNORMAL LOW (ref 6–20)
CO2: 21 mmol/L — ABNORMAL LOW (ref 22–32)
Calcium: 9.9 mg/dL (ref 8.9–10.3)
Chloride: 98 mmol/L (ref 98–111)
Creatinine, Ser: 0.84 mg/dL (ref 0.61–1.24)
GFR calc Af Amer: >60 mL/min (ref >60)
GFR calc non Af Amer: >60 mL/min (ref >60)
Glucose, Bld: 94 mg/dL (ref 70–99)
Potassium: 2.9 mmol/L — ABNORMAL LOW (ref 3.5–5.1)
Sodium: 133 mmol/L — ABNORMAL LOW (ref 135–145)
Total Bilirubin: 1 mg/dL (ref 0.3–1.2)
Total Protein: 7.5 g/dL (ref 6.5–8.1)

## 2020-04-18 LAB — RAPID URINE DRUG SCREEN, HOSP PERFORMED
Amphetamines: NOT DETECTED
Barbiturates: NOT DETECTED
Benzodiazepines: NOT DETECTED
Cocaine: NOT DETECTED
Opiates: NOT DETECTED
Tetrahydrocannabinol: POSITIVE — AB

## 2020-04-18 LAB — POC SARS CORONAVIRUS 2 AG -  ED: SARS Coronavirus 2 Ag: NEGATIVE

## 2020-04-18 LAB — ETHANOL: Alcohol, Ethyl (B): <10 mg/dL (ref <10)

## 2020-04-18 LAB — SALICYLATE LEVEL: Salicylate Lvl: <7 mg/dL — ABNORMAL LOW (ref 7.0–30.0)

## 2020-04-18 LAB — ACETAMINOPHEN LEVEL: Acetaminophen (Tylenol), Serum: <10 ug/mL — ABNORMAL LOW (ref 10–30)

## 2020-04-18 LAB — RESPIRATORY PANEL BY RT PCR (FLU A&B, COVID)
Influenza A by PCR: NEGATIVE
Influenza B by PCR: NEGATIVE
SARS Coronavirus 2 by RT PCR: NEGATIVE

## 2020-04-18 NOTE — ED Notes (Signed)
Locker number [id number].  

## 2020-04-18 NOTE — Progress Notes (Signed)
Patient meets criteria for inpatient treatment per Berneice Heinrich NP. He is in the process of being IVCed. CSW faxed referrals to the following facilities for review:  [facility] [place] [facility]  TTS will continue to seek bed placement.   Trula Slade, MSW, LCSW Clinical Social Worker 04/18/2020 12:52 PM
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
## 2020-04-18 NOTE — BHH Counselor (Signed)
Pt's mother is requesting pt call her at [phone]

## 2020-04-18 NOTE — ED Notes (Signed)
[id number] 1 bag

## 2020-04-18 NOTE — ED Provider Notes (Signed)
Behavioral Health Admission [facility])  Date: 04/18/20 Patient Name: Paul Bender MRN: [id number] Chief Complaint: No chief complaint on file.     Diagnoses:  Final diagnoses:  MDD (major depressive disorder), recurrent severe, without psychosis (HCC)  Psychosis, unspecified psychosis type (HCC)    HPI: Patient presents voluntarily to [facility] for walk-in assessment.  Patient accompanied by his mother.  Patient reports that mother remain present during assessment.  Patient assessed by nurse practitioner.  Patient speaks very little during assessment.  Patient makes minimal eye contact during assessment.  Patient appears to have paranoid ideations and appears to be responding to internal stimuli.  Patient visualized with restless behavior and endorses "racing thoughts."  Patient denies suicidal and homicidal ideations.  Patient has recent history of suicide attempt when he stepped in front of a transfer truck in May 2021.  Regarding hallucinations patient states "I hear all types of stuff."  Patient resides in [place] with his mother.  Patient denies access to weapons.  Patient is currently not employed outside of home.  Endorses marijuana use daily.  Patient denies substance use aside from marijuana.  Patient denies alcohol use.    Patient gives verbal consent to speak with mother, Elita Quick.  Patient's mother reports patient has not been sleeping well.  Patient's mother reports she is concerned for his safety as he has been walking all night long last night.  Patient's mother reports in the past he has used methamphetamines and this is the way he would act when he used.  Patient's mother reports she is unsure of any substance use as she was out of the home searching for a new house yesterday. Patient's mother reports recent stressor includes yesterday was patient's birthday and he did not receive a call from his ex-girlfriend that left the home  approximately 2 months ago with their child. Patient's mother reports she is concerned for his safety from a standpoint of self-harm related to his recent suicide attempt.  Patient's mother reports patient's behavior changed abruptly on yesterday. Patient's mother reports family history of bipolar disorder including herself, and patient's 2 brothers.  Patient and mother offered support and encouragement.  PHQ 2-9:     Admission (Discharged) from 01/10/2020 in [facility] to Hosp-Admission (Discharged) from 12/03/2019 in [place] [facility]  C-SSRS RISK CATEGORY Error: Q3, 4, or 5 should not be populated when Q2 is No High Risk       Total Time spent with patient: 30 minutes  Musculoskeletal  Strength & Muscle Tone: within normal limits Gait & Station: normal Patient leans: N/A  Psychiatric Specialty Exam  Presentation General Appearance: Casual  Eye Contact:Minimal  Speech:Slow  Speech Volume:Decreased  Handedness:Right   Mood and Affect  Mood:Anxious;Depressed  Affect:Depressed;Inappropriate   Thought Process  Thought Processes:Coherent;Goal Directed  Descriptions of Associations:Intact  Orientation:Full (Time, Place and Person)  Thought Content:Paranoid Ideation  Hallucinations:Hallucinations: None  Ideas of Reference:Paranoia  Suicidal Thoughts:Suicidal Thoughts: No  Homicidal Thoughts:Homicidal Thoughts: No   Sensorium  Memory:Immediate Fair;Recent Fair;Remote Fair  Judgment:Fair  Insight:Lacking   Executive Functions  Concentration:Fair  Attention Span:Poor  Recall:Fair  Fund of Knowledge:Fair  Language:Fair   Psychomotor Activity  Psychomotor Activity:Psychomotor Activity: Increased;Restlessness   Assets  Assets:Desire for Improvement;Housing;Social Support;Resilience   Sleep  Sleep:Sleep: Poor   Physical Exam Vitals and nursing note reviewed.  Constitutional:       Appearance: He is well-developed.  HENT:  Head: Normocephalic.  Cardiovascular:     Rate and Rhythm: Normal rate.  Pulmonary:     Effort: Pulmonary effort is normal.  Neurological:     Mental Status: He is alert and oriented to person, place, and time.  Psychiatric:        Attention and Perception: He perceives auditory hallucinations.        Mood and Affect: Mood is anxious and depressed. Affect is inappropriate.        Speech: Speech is delayed.        Behavior: Behavior is hyperactive.        Thought Content: Thought content is paranoid.        Judgment: Judgment is impulsive.    Review of Systems  Constitutional: Negative.   HENT: Negative.   Eyes: Negative.   Respiratory: Negative.   Cardiovascular: Negative.   Gastrointestinal: Negative.   Genitourinary: Negative.   Musculoskeletal: Negative.   Skin: Negative.   Neurological: Negative.   Endo/Heme/Allergies: Negative.   Psychiatric/Behavioral: Positive for hallucinations and substance abuse. The patient is nervous/anxious and has insomnia.     Blood pressure (!) 146/83, pulse (!) 125, temperature 97.9 F (36.6 C), temperature source Oral, resp. rate 16, height 6\' 1"  (1.854 m), weight 70.3 kg, SpO2 100 %. Body mass index is 20.45 kg/m.  Past Psychiatric History: Major depressive disorder without psychotic features, substance use disorder  Is the patient at risk to self? Yes  Has the patient been a risk to self in the past 6 months? Yes .    Has the patient been a risk to self within the distant past? No   Is the patient a risk to others? No   Has the patient been a risk to others in the past 6 months? No   Has the patient been a risk to others within the distant past? No   Past Medical History: No past medical history on file.  Past Surgical History:  Procedure Laterality Date  . CLOSED REDUCTION MANDIBLE N/A 12/20/2019   Procedure: Replacement of Mandibular-Maxillary Fixation;  Surgeon: 12/22/2019, MD;   Location: [facility] OR;  Service: ENT;  Laterality: N/A;  . I & D EXTREMITY Left 12/03/2019   Procedure: IRRIGATION AND DEBRIDEMENT EXTREMITY;  Surgeon: 02/02/2020, MD;  Location: [facility] OR;  Service: Orthopedics;  Laterality: Left;  . INSERTION OF TRACTION PIN Right 12/03/2019   Procedure: INSERTION OF TRACTION PIN;  Surgeon: 02/02/2020, MD;  Location: [facility] OR;  Service: Orthopedics;  Laterality: Right;  . IR ANGIOGRAM PELVIS SELECTIVE OR SUPRASELECTIVE  12/03/2019  . IR ANGIOGRAM SELECTIVE EACH ADDITIONAL VESSEL  12/03/2019  . IR EMBO ART  VEN HEMORR LYMPH EXTRAV  INC GUIDE ROADMAPPING  12/03/2019  . IR 02/02/2020 GUIDE VASC ACCESS LEFT  12/03/2019  . JOINT REPLACEMENT     multiple ortho sx  . ORIF ACETABULAR FRACTURE Right 12/04/2019   Procedure: OPEN REDUCTION INTERNAL FIXATION (ORIF) ACETABULAR FRACTURE W/ DRESSING CHANGE LEFT LEG;  Surgeon: 12/06/2019, MD;  Location: [facility] OR;  Service: Orthopedics;  Laterality: Right;  . ORIF MANDIBULAR FRACTURE N/A 12/03/2019   Procedure: MANDIBULAR-MAXILLARY FIXATION;  Surgeon: 02/02/2020, MD;  Location: [facility] OR;  Service: Orthopedics;  Laterality: N/A;  . ORIF MANDIBULAR FRACTURE N/A 12/26/2019   Procedure: OPEN REDUCTION INTERNAL FIXATION (ORIF) MANDIBULAR FRACTURE;  Surgeon: 02/25/2020, MD;  Location: [facility] OR;  Service: ENT;  Laterality: N/A;  . PERCUTANEOUS PINNING Left 01/05/2020   Procedure: OPEN REDUCTION INTERNAL FIXATION (  ORIF) FINGER THUMB;  Surgeon: Bradly Bienenstock, MD;  Location: [facility] OR;  Service: Orthopedics;  Laterality: Left;  . RADIOLOGY WITH ANESTHESIA N/A 12/03/2019   Procedure: IR WITH ANESTHESIA;  Surgeon: Radiologist, Medication, MD;  Location: [facility] OR;  Service: Radiology;  Laterality: N/A;  . SCALP LACERATION REPAIR Right 12/03/2019   Procedure: CLOSURE SCALP LACERATION;  Surgeon: Roby Lofts, MD;  Location: [facility] OR;  Service: Orthopedics;  Laterality: Right;  . TIBIA IM NAIL INSERTION Left 12/03/2019   Procedure: INTRAMEDULLARY (IM) NAIL TIBIAL;   Surgeon: Roby Lofts, MD;  Location: [facility] OR;  Service: Orthopedics;  Laterality: Left;  . TIBIA IM NAIL INSERTION Left 03/17/2020   Procedure: REPAIR LEFT TIBIAL NONUNION WITH RIA HARVEST;  Surgeon: Roby Lofts, MD;  Location: [facility] OR;  Service: Orthopedics;  Laterality: Left;    Family History: No family history on file.  Social History:  Social History   Socioeconomic History  . Marital status: Single    Spouse name: Not on file  . Number of children: Not on file  . Years of education: Not on file  . Highest education level: Not on file  Occupational History  . 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 Smoking status: Never Smoker  . Smokeless tobacco: Never Used  Vaping Use  . Vaping Use: Never used  Substance and Sexual Activity  . Alcohol use: Not Currently  . Drug use: Not Currently  . Sexual activity: Yes  Other Topics Concern  . Not on file  Social History Narrative   ** Merged History Encounter **       Social Determinants of Health   Financial Resource Strain:   . Difficulty of Paying Living Expenses: Not on file  Food Insecurity:   . Worried About Programme researcher, broadcasting/film/video in the Last Year: Not on file  . Ran Out of Food in the Last Year: Not on file  Transportation Needs:   . Lack of Transportation (Medical): Not on file  . Lack of Transportation (Non-Medical): Not on file  Physical Activity:   . Days of Exercise per Week: Not on file  . Minutes of Exercise per Session: Not on file  Stress:   . Feeling of Stress : Not on file  Social Connections:   . Frequency of Communication with Friends and Family: Not on file  . Frequency of Social Gatherings with Friends and Family: Not on file  . Attends Religious Services: Not on file  . Active Member of Clubs or Organizations: Not on file  . Attends Banker Meetings: Not on file  . Marital Status: Not on file  Intimate Partner Violence:   . Fear of Current or Ex-Partner: Not on file  . Emotionally Abused:  Not on file  . Physically Abused: Not on file  . Sexually Abused: Not on file    SDOH:  SDOH Screenings   Alcohol Screen: Low Risk   . Last Alcohol Screening Score (AUDIT): 0  Depression (PHQ2-9):   . PHQ-2 Score: Not on file  Financial Resource Strain:   . Difficulty of Paying Living Expenses: Not on file  Food Insecurity:   . Worried About Programme researcher, broadcasting/film/video in the Last Year: Not on file  . Ran Out of Food in the Last Year: Not on file  Housing:   . Last Housing Risk Score: Not on file  Physical Activity:   . Days of Exercise per Week: Not on file  . Minutes of Exercise  per Session: Not on file  Social Connections:   . Frequency of Communication with Friends and Family: Not on file  . Frequency of Social Gatherings with Friends and Family: Not on file  . Attends Religious Services: Not on file  . Active Member of Clubs or Organizations: Not on file  . Attends Banker Meetings: Not on file  . Marital Status: Not on file  Stress:   . Feeling of Stress : Not on file  Tobacco Use: Low Risk   . Smoking Tobacco Use: Never Smoker  . Smokeless Tobacco Use: Never Used  Transportation Needs:   . Freight forwarder (Medical): Not on file  . Lack of Transportation (Non-Medical): Not on file    Last Labs:  Admission on 03/17/2020, Discharged on 03/17/2020  Component Date Value Ref Range Status  . WBC 03/17/2020 6.2  4.0 - 10.5 K/uL Final  . RBC 03/17/2020 3.71* 4.22 - 5.81 MIL/uL Final  . Hemoglobin 03/17/2020 10.9* 13.0 - 17.0 g/dL Final  . HCT 76/19/5093 34.9* 39 - 52 % Final  . MCV 03/17/2020 94.1  80.0 - 100.0 fL Final  . MCH 03/17/2020 29.4  26.0 - 34.0 pg Final  . MCHC 03/17/2020 31.2  30.0 - 36.0 g/dL Final  . RDW 26/71/2458 16.1* 11.5 - 15.5 % Final  . Platelets 03/17/2020 233  150 - 400 K/uL Final  . nRBC 03/17/2020 0.0  0.0 - 0.2 % Final   Performed at [facility], [address] [place], [place] [address]  Admission on 01/10/2020,  Discharged on 01/13/2020  Component Date Value Ref Range Status  . WBC 01/11/2020 9.7  4.0 - 10.5 K/uL Final  . RBC 01/11/2020 2.91* 4.22 - 5.81 MIL/uL Final  . Hemoglobin 01/11/2020 8.4* 13.0 - 17.0 g/dL Final  . HCT 38/25/0539 26.4* 39 - 52 % Final  . MCV 01/11/2020 90.7  80.0 - 100.0 fL Final  . MCH 01/11/2020 28.9  26.0 - 34.0 pg Final  . MCHC 01/11/2020 31.8  30.0 - 36.0 g/dL Final  . RDW 76/73/4193 15.5  11.5 - 15.5 % Final  . Platelets 01/11/2020 192  150 - 400 K/uL Final  . nRBC 01/11/2020 0.0  0.0 - 0.2 % Final   Performed at [facility], [address] [place], [place] [address]  . Sodium 01/11/2020 134* 135 - 145 mmol/L Final  . Potassium 01/11/2020 3.6  3.5 - 5.1 mmol/L Final  . Chloride 01/11/2020 95* 98 - 111 mmol/L Final  . CO2 01/11/2020 30  22 - 32 mmol/L Final  . Glucose, Bld 01/11/2020 111* 70 - 99 mg/dL Final   Glucose reference range applies only to samples taken after fasting for at least 8 hours.  . BUN 01/11/2020 13  6 - 20 mg/dL Final  . Creatinine, Ser 01/11/2020 0.65  0.61 - 1.24 mg/dL Final  . Calcium 09/73/5329 8.6* 8.9 - 10.3 mg/dL Final  . Total Protein 01/11/2020 6.5  6.5 - 8.1 g/dL Final  . Albumin 92/42/6834 3.1* 3.5 - 5.0 g/dL Final  . AST 19/62/2297 38  15 - 41 U/L Final  . ALT 01/11/2020 55* 0 - 44 U/L Final  . Alkaline Phosphatase 01/11/2020 159* 38 - 126 U/L Final  . Total Bilirubin 01/11/2020 0.6  0.3 - 1.2 mg/dL Final  . GFR calc non Af Amer 01/11/2020 >60  >60 mL/min Final  . GFR calc Af Amer 01/11/2020 >60  >60 mL/min Final  . Anion gap 01/11/2020  9  5 - 15 Final   Performed at [facility], [address] [place], [place] [address]  . Magnesium 01/11/2020 2.0  1.7 - 2.4 mg/dL Final   Performed at [facility], [address], [place], [place] [address]  . Phosphorus 01/11/2020 2.9  2.5 - 4.6 mg/dL Final   Performed at [facility], [address]  [place], [place] [address]  . WBC 01/12/2020 5.7  4.0 - 10.5 K/uL Final  . RBC 01/12/2020 2.71* 4.22 - 5.81 MIL/uL Final  . Hemoglobin 01/12/2020 7.9* 13.0 - 17.0 g/dL Final  . HCT 91/47/8295 23.8* 39 - 52 % Final  . MCV 01/12/2020 87.8  80.0 - 100.0 fL Final  . MCH 01/12/2020 29.2  26.0 - 34.0 pg Final  . MCHC 01/12/2020 33.2  30.0 - 36.0 g/dL Final  . RDW 62/13/0865 15.7* 11.5 - 15.5 % Final  . Platelets 01/12/2020 180  150 - 400 K/uL Final   Comment: SPECIMEN CHECKED FOR CLOTS PLATELET COUNT CONFIRMED BY SMEAR REPEATED TO VERIFY   . nRBC 01/12/2020 0.0  0.0 - 0.2 % Final  . Neutrophils Relative % 01/12/2020 68  % Final  . Neutro Abs 01/12/2020 3.9  1.7 - 7.7 K/uL Final  . Lymphocytes Relative 01/12/2020 14  % Final  . Lymphs Abs 01/12/2020 0.8  0.7 - 4.0 K/uL Final  . Monocytes Relative 01/12/2020 14  % Final  . Monocytes Absolute 01/12/2020 0.8  0 - 1 K/uL Final  . Eosinophils Relative 01/12/2020 2  % Final  . Eosinophils Absolute 01/12/2020 0.1  0 - 0 K/uL Final  . Basophils Relative 01/12/2020 0  % Final  . Basophils Absolute 01/12/2020 0.0  0 - 0 K/uL Final  . Immature Granulocytes 01/12/2020 2  % Final  . Abs Immature Granulocytes 01/12/2020 0.10* 0.00 - 0.07 K/uL Final   Performed at [facility], [address] [place], [place] [address]  . Sodium 01/12/2020 134* 135 - 145 mmol/L Final  . Potassium 01/12/2020 4.1  3.5 - 5.1 mmol/L Final  . Chloride 01/12/2020 100  98 - 111 mmol/L Final  . CO2 01/12/2020 26  22 - 32 mmol/L Final  . Glucose, Bld 01/12/2020 105* 70 - 99 mg/dL Final   Glucose reference range applies only to samples taken after fasting for at least 8 hours.  . BUN 01/12/2020 14  6 - 20 mg/dL Final  . Creatinine, Ser 01/12/2020 0.77  0.61 - 1.24 mg/dL Final  . Calcium 62/95/2841 8.1* 8.9 - 10.3 mg/dL Final  . Total Protein 01/12/2020 5.8* 6.5 - 8.1 g/dL Final  . Albumin 32/44/0102 2.6* 3.5 - 5.0 g/dL Final  . AST 72/53/6644 48* 15 - 41 U/L  Final  . ALT 01/12/2020 54* 0 - 44 U/L Final  . Alkaline Phosphatase 01/12/2020 135* 38 - 126 U/L Final  . Total Bilirubin 01/12/2020 0.5  0.3 - 1.2 mg/dL Final  . GFR calc non Af Amer 01/12/2020 >60  >60 mL/min Final  . GFR calc Af Amer 01/12/2020 >60  >60 mL/min Final  . Anion gap 01/12/2020 8  5 - 15 Final   Performed at [facility], [address] [place], [place] [address]  . Magnesium 01/12/2020 1.9  1.7 - 2.4 mg/dL Final   Performed at [facility], [address], [place], [place] [address]  . Phosphorus 01/12/2020 3.2  2.5 - 4.6 mg/dL Final   Performed at [facility], [address] [place], [place] [address]  . Hgb A1c MFr Bld 01/13/2020 5.2  4.8 - 5.6 % Final   Comment: (NOTE)         Prediabetes: 5.7 - 6.4         Diabetes: >6.4         Glycemic control for adults with diabetes: <7.0   . Mean Plasma Glucose 01/13/2020 103  mg/dL Final   Comment: (NOTE) Performed At: [facility] [address] [place], [place] [id number] Jolene SchimkeNagendra Sanjai MD XB:[id number]:[phone]   No results displayed because visit has over 200 results.      Allergies: Patient has no known allergies.  PTA Medications: (Not in a hospital admission)   Medical Decision Making  Discussed initiating antipsychotic medications to target racing thoughts.  Patient demonstrates understanding.  Recommend: -Zyprexa 5mg  BID     Recommendations  Based on my evaluation the patient appears to have an emergency medical condition for which I recommend the patient be transferred to the emergency department for further evaluation.   Patient reviewed with Dr. Nelly RoutArchana Kumar. Inpatient psychiatric treatment recommended.  Patient requiring involuntary commitment petition at this time.  Patrcia Dollyina L Tate, FNP 04/18/20  12:11 PM

## 2020-04-19 LAB — RESPIRATORY PANEL BY RT PCR (FLU A&B, COVID)
Influenza A by PCR: NEGATIVE
Influenza B by PCR: NEGATIVE
SARS Coronavirus 2 by RT PCR: NEGATIVE

## 2020-04-19 MED ORDER — HYDRALAZINE HCL 10 MG PO TABS
10.0000 mg | ORAL_TABLET | Freq: Once | ORAL | Status: AC
  Administered 2020-04-19: 10 mg via ORAL
  Filled 2020-04-19: qty 1

## 2020-04-19 MED ORDER — POTASSIUM CHLORIDE CRYS ER 20 MEQ PO TBCR
40.0000 meq | EXTENDED_RELEASE_TABLET | Freq: Once | ORAL | Status: AC
  Administered 2020-04-19: 40 meq via ORAL
  Filled 2020-04-19: qty 2

## 2020-04-19 MED ORDER — HYDROXYZINE HCL 25 MG PO TABS
25.0000 mg | ORAL_TABLET | Freq: Once | ORAL | Status: AC
  Administered 2020-04-19: 25 mg via ORAL
  Filled 2020-04-19: qty 1

## 2020-04-19 NOTE — ED Notes (Signed)
Pt came out of room stating he was going outside. RN redirected him back and he was compliant
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
## 2020-04-19 NOTE — ED Provider Notes (Signed)
Emergency Medicine Observation Re-evaluation Note  Paul Bender is a 29 y.o. male, seen on rounds today.  Pt initially presented to the ED for complaints of IVC and Suicidal Currently, the patient is alert and wanting to leave.  He has a splint on left ankle, chronically.  Still has mild limp.  Denies any pain or any new complaints.  He does state that he wants to go home.  He had spoken with his therapist to the other day and states that his depression is "not as bad" as it was before during previous suicide attempt.  Will continue to await repeat TTS consult.    Physical Exam  BP (!) 155/114 (BP Location: Left Arm)   Pulse 66   Temp 99.1 F (37.3 C) (Oral)   Resp 19   SpO2 100%  Physical Exam Vitals and nursing note reviewed. Exam conducted with a chaperone present.  Constitutional:      Appearance: Normal appearance.  HENT:     Head: Normocephalic and atraumatic.  Eyes:     General: No scleral icterus.    Conjunctiva/sclera: Conjunctivae normal.  Cardiovascular:     Rate and Rhythm: Normal rate.     Pulses: Normal pulses.  Pulmonary:     Effort: Pulmonary effort is normal.  Musculoskeletal:     Comments: Splint on left ankle.  Ambulatory.  Moves all extremities.  Skin:    General: Skin is dry.  Neurological:     Mental Status: He is alert and oriented to person, place, and time.     GCS: GCS eye subscore is 4. GCS verbal subscore is 5. GCS motor subscore is 6.  Psychiatric:        Mood and Affect: Mood normal.        Behavior: Behavior normal.        Thought Content: Thought content normal.      ED Course / MDM  EKG:    I have reviewed the labs performed to date as well as medications administered while in observation.  Recent changes in the last 24 hours include no overnight events.  Plan  Current plan is for inpatient admission.  Awaiting repeat TTS consult.   Patient is under full IVC at this time, filed by his mother according to prior notes. Hx of recent  suicide attempt.  Still endorses intermittent SI.      Lorelee New, PA-C 04/19/20 1249    Margarita Grizzle, MD 04/21/20 [phone]

## 2020-04-19 NOTE — Progress Notes (Signed)
Pt accepted to [facility]    Dr. Estill Cotta is the accepting/attending provider  Call report to [phone]  [facility] ED notified.     Pt is scheduled to arrive at [facility] after 5pm today, and must test negative for Covid prior to transport.    Wells Guiles, MSW, LCSW, LCAS Clinical Social Worker II Disposition CSW [phone]

## 2020-04-19 NOTE — ED Notes (Signed)
Leanne RN at [phone]

## 2020-04-19 NOTE — Progress Notes (Signed)
Pt meets inpatient criteria per Berneice Heinrich, NP. Referral information has been sent to the following hospitals for review:  [facility]  [facility]  [facility]  [facility]  [facility]      Disposition will continue to follow.    Wells Guiles, MSW, LCSW, LCAS Clinical Social Worker II Disposition CSW [phone]

## 2020-04-19 NOTE — ED Notes (Signed)
Pt has been noncompliant in triage, scooting around in recliner chair, not maintaining distance from other patients in area, rummaging threw things, moving chairs, pt has been redirected multiple times and he laughs at his behaviors.

## 2020-04-19 NOTE — ED Notes (Signed)
RN attempted to call sheriff's office to arrange for transport. No answer.

## 2020-04-19 NOTE — ED Notes (Signed)
Sheriff returned call and transportation set up for 4pm for pt to arrive after 5pm to [facility]

## 2020-09-02 ENCOUNTER — Encounter (HOSPITAL_COMMUNITY): Payer: Self-pay | Admitting: Emergency Medicine

## 2020-09-02 ENCOUNTER — Other Ambulatory Visit: Payer: Self-pay

## 2020-09-02 ENCOUNTER — Ambulatory Visit (HOSPITAL_COMMUNITY)
Admission: EM | Admit: 2020-09-02 | Discharge: 2020-09-03 | Disposition: A | Discharge status: Home / Self Care | Payer: Medicaid - Out of State | Attending: Psychiatry | Admitting: Psychiatry

## 2020-09-02 DIAGNOSIS — Z20822 Contact with and (suspected) exposure to covid-19: Secondary | ICD-10-CM | POA: Diagnosis not present

## 2020-09-02 DIAGNOSIS — F1290 Cannabis use, unspecified, uncomplicated: Secondary | ICD-10-CM | POA: Insufficient documentation

## 2020-09-02 DIAGNOSIS — Z79899 Other long term (current) drug therapy: Secondary | ICD-10-CM | POA: Insufficient documentation

## 2020-09-02 DIAGNOSIS — F251 Schizoaffective disorder, depressive type: Secondary | ICD-10-CM

## 2020-09-02 DIAGNOSIS — F419 Anxiety disorder, unspecified: Secondary | ICD-10-CM | POA: Insufficient documentation

## 2020-09-02 DIAGNOSIS — F29 Unspecified psychosis not due to a substance or known physiological condition: Secondary | ICD-10-CM

## 2020-09-02 DIAGNOSIS — Z818 Family history of other mental and behavioral disorders: Secondary | ICD-10-CM | POA: Insufficient documentation

## 2020-09-02 DIAGNOSIS — R45851 Suicidal ideations: Secondary | ICD-10-CM

## 2020-09-02 DIAGNOSIS — R450 Nervousness: Secondary | ICD-10-CM | POA: Insufficient documentation

## 2020-09-02 DIAGNOSIS — G4700 Insomnia, unspecified: Secondary | ICD-10-CM | POA: Insufficient documentation

## 2020-09-02 LAB — POCT URINE DRUG SCREEN - MANUAL ENTRY (I-SCREEN)
POC Amphetamine UR: NOT DETECTED
POC Buprenorphine (BUP): NOT DETECTED
POC Cocaine UR: NOT DETECTED
POC Marijuana UR: POSITIVE — AB
POC Methadone UR: NOT DETECTED
POC Methamphetamine UR: NOT DETECTED
POC Morphine: NOT DETECTED
POC Oxazepam (BZO): POSITIVE — AB
POC Oxycodone UR: NOT DETECTED
POC Secobarbital (BAR): NOT DETECTED

## 2020-09-02 LAB — POC SARS CORONAVIRUS 2 AG: SARS Coronavirus 2 Ag: NEGATIVE

## 2020-09-02 LAB — POC SARS CORONAVIRUS 2 AG -  ED: SARS Coronavirus 2 Ag: NEGATIVE

## 2020-09-02 MED ORDER — LORAZEPAM 2 MG/ML IJ SOLN
2.0000 mg | Freq: Once | INTRAMUSCULAR | Status: AC
  Administered 2020-09-02: 2 mg via INTRAMUSCULAR
  Filled 2020-09-02: qty 1

## 2020-09-02 MED ORDER — ALUM & MAG HYDROXIDE-SIMETH 200-200-20 MG/5ML PO SUSP: 30.0000 mL | ORAL | Status: DC | PRN

## 2020-09-02 MED ORDER — ARIPIPRAZOLE 5 MG PO TABS
15.0000 mg | ORAL_TABLET | Freq: Every day | ORAL | Status: DC
  Administered 2020-09-03: 15 mg via ORAL
  Filled 2020-09-02: qty 1
  Filled 2020-09-02: qty 21

## 2020-09-02 MED ORDER — LORAZEPAM 1 MG PO TABS: 2.0000 mg | ORAL_TABLET | Freq: Four times a day (QID) | ORAL | Status: DC | PRN

## 2020-09-02 MED ORDER — BENZTROPINE MESYLATE 1 MG/ML IJ SOLN: 1.0000 mg | Freq: Two times a day (BID) | INTRAMUSCULAR | Status: DC | PRN

## 2020-09-02 MED ORDER — HALOPERIDOL LACTATE 5 MG/ML IJ SOLN: 5.0000 mg | Freq: Four times a day (QID) | INTRAMUSCULAR | Status: DC | PRN

## 2020-09-02 MED ORDER — TRAZODONE HCL 50 MG PO TABS
50.0000 mg | ORAL_TABLET | Freq: Every evening | ORAL | Status: DC | PRN
  Administered 2020-09-02: 50 mg via ORAL
  Filled 2020-09-02: qty 1

## 2020-09-02 MED ORDER — LORAZEPAM 2 MG/ML IJ SOLN: 2.0000 mg | Freq: Four times a day (QID) | INTRAMUSCULAR | Status: DC | PRN

## 2020-09-02 MED ORDER — MAGNESIUM HYDROXIDE 400 MG/5ML PO SUSP: 30.0000 mL | Freq: Every day | ORAL | Status: DC | PRN

## 2020-09-02 MED ORDER — BENZTROPINE MESYLATE 1 MG PO TABS
1.0000 mg | ORAL_TABLET | Freq: Two times a day (BID) | ORAL | Status: DC | PRN
  Filled 2020-09-02: qty 14

## 2020-09-02 MED ORDER — DIVALPROEX SODIUM 250 MG PO DR TAB
250.0000 mg | DELAYED_RELEASE_TABLET | Freq: Two times a day (BID) | ORAL | Status: DC
  Administered 2020-09-02 – 2020-09-03 (×2): 250 mg via ORAL
  Filled 2020-09-02: qty 1
  Filled 2020-09-02: qty 14
  Filled 2020-09-02: qty 1

## 2020-09-02 MED ORDER — ACETAMINOPHEN 325 MG PO TABS: 650.0000 mg | ORAL_TABLET | Freq: Four times a day (QID) | ORAL | Status: DC | PRN

## 2020-09-02 MED ORDER — HYDROXYZINE HCL 25 MG PO TABS: 25.0000 mg | ORAL_TABLET | Freq: Three times a day (TID) | ORAL | Status: DC | PRN

## 2020-09-02 MED ORDER — DIPHENHYDRAMINE HCL 50 MG/ML IJ SOLN
50.0000 mg | Freq: Once | INTRAMUSCULAR | Status: AC
  Administered 2020-09-02: 50 mg via INTRAMUSCULAR
  Filled 2020-09-02: qty 1

## 2020-09-02 MED ORDER — ARIPIPRAZOLE 5 MG PO TABS
5.0000 mg | ORAL_TABLET | Freq: Once | ORAL | Status: AC
  Administered 2020-09-02: 5 mg via ORAL
  Filled 2020-09-02: qty 1

## 2020-09-02 MED ORDER — AMLODIPINE BESYLATE 10 MG PO TABS
10.0000 mg | ORAL_TABLET | Freq: Every day | ORAL | Status: DC
  Administered 2020-09-02 – 2020-09-03 (×2): 10 mg via ORAL
  Filled 2020-09-02 (×2): qty 1
  Filled 2020-09-02: qty 7

## 2020-09-02 MED ORDER — HALOPERIDOL LACTATE 5 MG/ML IJ SOLN
5.0000 mg | Freq: Once | INTRAMUSCULAR | Status: AC
  Administered 2020-09-02: 5 mg via INTRAMUSCULAR
  Filled 2020-09-02: qty 1

## 2020-09-02 MED ORDER — HALOPERIDOL 5 MG PO TABS: 5.0000 mg | ORAL_TABLET | Freq: Four times a day (QID) | ORAL | Status: DC | PRN
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
## 2020-09-02 NOTE — ED Notes (Signed)
Pt resting comfortably at present, no distress noted, calm & cooperative, monitoring for safety.

## 2020-09-02 NOTE — ED Triage Notes (Signed)
Presents with paranoia, responding to internal stimuli, repeatedly saying stop to the voices in his head.  Denies SI or HI.  Family reports pt hasn't slept in 2-3 days.

## 2020-09-02 NOTE — ED Notes (Signed)
PATIENT BELONGINGS ARE STORED IN LOCKER #17 

## 2020-09-02 NOTE — ED Provider Notes (Signed)
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 basis for treatment or other patient management decisions. A negative result may occur with  improper specimen collection/handling, submission of specimen other than nasopharyngeal swab, presence of viral mutation(s) within the areas targeted by this assay, and inadequate number of viral copies (<131 copies/mL). A negative result must be combined with clinical observations, patient history, and epidemiological information. The expected result is Negative.  Fact Sheet for Patients:  https://www.moore.com/  Fact Sheet for Healthcare Providers:  https://www.young.biz/  This test is no                           t yet approved or cleared by the [place] FDA and  has been authorized for detection and/or diagnosis of SARS-CoV-2 by FDA under an Emergency Use Authorization (EUA). This EUA will remain  in effect (meaning this test can be used) for the duration of the COVID-19 declaration under Section 564(b)(1) of the Act, 21 U.S.C. section 360bbb-3(b)(1), unless the authorization is terminated or revoked sooner.    . Influenza A by PCR 04/19/2020 NEGATIVE  NEGATIVE Final  . Influenza B by PCR 04/19/2020 NEGATIVE  NEGATIVE Final   Comment: (NOTE) The Xpert Xpress SARS-CoV-2/FLU/RSV assay is intended as an aid in  the diagnosis of influenza from Nasopharyngeal swab specimens and  should not be used as a sole basis for treatment. Nasal washings and  aspirates are unacceptable  for Xpert Xpress SARS-CoV-2/FLU/RSV  testing.  Fact Sheet for Patients: https://www.moore.com/  Fact Sheet for Healthcare Providers: https://www.young.biz/  This test is not yet approved or cleared by the [place] FDA and  has been authorized for detection and/or diagnosis of SARS-CoV-2 by  FDA under an Emergency Use Authorization (EUA). This EUA will remain  in effect (meaning this test can be used) for the duration of the  Covid-19 declaration under Section 564(b)(1) of the Act, 21  U.S.C. section 360bbb-3(b)(1), unless the authorization is  terminated or revoked. Performed at [facility], [address], [place], [place] [address]   Admission on 04/18/2020, Discharged on 04/18/2020  Component Date Value Ref Range Status  . SARS Coronavirus 2 Ag 04/18/2020 Negative  Negative Final  . SARS Coronavirus 2 by RT PCR 04/18/2020 NEGATIVE  NEGATIVE Final   Comment: (NOTE) SARS-CoV-2 target nucleic acids are NOT DETECTED.  The SARS-CoV-2 RNA is generally detectable in upper respiratoy specimens during the acute phase of infection. The lowest concentration  of SARS-CoV-2 viral copies this assay can detect is 131 copies/mL. A negative result does not preclude SARS-Cov-2 infection and should not be used as the sole basis for treatment or other patient management decisions. A negative result may occur with  improper specimen collection/handling, submission of specimen other than nasopharyngeal swab, presence of viral mutation(s) within the areas targeted by this assay, and inadequate number of viral copies (<131 copies/mL). A negative result must be combined with clinical observations, patient history, and epidemiological information. The expected result is Negative.  Fact Sheet for Patients:  https://www.moore.com/  Fact Sheet for Healthcare Providers:  https://www.young.biz/  This test is no                          t yet approved or cleared by the [place] FDA and  has been authorized for detection and/or diagnosis of SARS-CoV-2 by FDA under an Emergency Use Authorization (EUA). This EUA will remain  in effect (meaning this test can be used) for the duration of the COVID-19 declaration under Section 564(b)(1) of the Act, 21 U.S.C. section 360bbb-3(b)(1), unless the authorization is terminated or revoked sooner.    . Influenza A by PCR 04/18/2020 NEGATIVE  NEGATIVE Final  . Influenza B by PCR 04/18/2020 NEGATIVE  NEGATIVE Final   Comment: (NOTE) The Xpert Xpress SARS-CoV-2/FLU/RSV assay is intended as an aid in  the diagnosis of influenza from Nasopharyngeal swab specimens and  should not be used as a sole basis for treatment. Nasal washings and  aspirates are unacceptable for Xpert Xpress SARS-CoV-2/FLU/RSV  testing.  Fact Sheet for Patients: https://www.moore.com/  Fact Sheet for Healthcare Providers: https://www.young.biz/  This test is not yet approved or cleared by the [place] FDA and  has been authorized for detection and/or diagnosis  of SARS-CoV-2 by  FDA under an Emergency Use Authorization (EUA). This EUA will remain  in effect (meaning this test can be used) for the duration of the  Covid-19 declaration under Section 564(b)(1) of the Act, 21  U.S.C. section 360bbb-3(b)(1), unless the authorization is  terminated or revoked. Performed at [facility], [address] [place], [place] [address]   Admission on 03/17/2020, Discharged on 03/17/2020  Component Date Value Ref Range Status  . WBC 03/17/2020 6.2  4.0 - 10.5 K/uL Final  . RBC 03/17/2020 3.71* 4.22 - 5.81 MIL/uL Final  . Hemoglobin 03/17/2020 10.9* 13.0 - 17.0 g/dL Final  .  HCT 03/17/2020 34.9* 39.0 - 52.0 % Final  . MCV 03/17/2020 94.1  80.0 - 100.0 fL Final  . MCH 03/17/2020 29.4  26.0 - 34.0 pg Final  . MCHC 03/17/2020 31.2  30.0 - 36.0 g/dL Final  . RDW 40/98/1191 16.1* 11.5 - 15.5 % Final  . Platelets 03/17/2020 233  150 - 400 K/uL Final  . nRBC 03/17/2020 0.0  0.0 - 0.2 % Final   Performed at [facility], [address] [place], [place] [address]    Allergies: Patient has no known allergies.  PTA Medications: (Not in a hospital admission)   Medical Decision Making  Admission orders placed.  Haldol 5 mg IM X 1 dose for psychosis/agitation Ativan 2 mg IM x 1 dose for psychosis/agitation Benadryl 50 mg IM x 1 dose for psychosis/agitation  Restart home medications -Abilify 15 mg daily for psychosis, give Abilify 5 mg tonight -Restart Depakote at 250 mg BID for mood stability, first dose tonight -Norvasc 10 mg daily for HTN  Agitation protocol -Haldol 5 mg IM or PO every 6 hours prn -Ativan 2 mg Every 6 hours prn  IM or PO -Cogentin 1 mg BID prn IM or PO for EPS/Tremors  Clinical Course as of 09/03/20 0323  Fri Sep 03, 2020  0112 POCT Urine Drug Screen - ([facility])(!) UDS positive for marijuana and oxazepam, received ativan prior to UDS.  [JB]  0113 SARS Coronavirus 2 by RT PCR: NEGATIVE [JB]  0233 CBC with  Differential/Platelet(!) CBC unremarkable [JB]  0251 Potassium(!): 3.2 Potassium 3.3, replace with oral potassium 40 mEq x 1 dose. Sodium 133-will likely improve with oral intake.  [JB]  0252 Lipid panel Lipid Panel unremarkable [JB]  0252 Alcohol, Ethyl (B): <10 [JB]  0252 Hemoglobin A1C: 4.9 [JB]  0303 TSH: 1.487 [JB]  0316 Valproic Acid,S(!): <10 [JB]    Clinical Course User Index [JB] Jackelyn Poling, NP    Recommendations  Based on my evaluation the patient does not appear to have an emergency medical condition.   Patient will be placed in the continuous assessment area at [facility] for treatment and stabilization. He will be reevaluated on 09/03/2020. Patient may require inpatient treatment for stabilization.   Jackelyn Poling, NP 09/03/20  3:23 AM

## 2020-09-02 NOTE — ED Notes (Signed)
Pt very paranoid, responding to internal stimuli, repeatedly saying stop to the voices in his head. Pt eval by NP Nira Conn, meds ordered, given by staff without incident, pt tolerated well.

## 2020-09-02 NOTE — ED Notes (Addendum)
PT. Is pacing, shouting and unstable to sit down for full vital assessment.

## 2020-09-02 NOTE — ED Notes (Signed)
Pt now answering questions, able to focus more, family at bedside.  Monitoring for safety.

## 2020-09-03 LAB — RESP PANEL BY RT-PCR (FLU A&B, COVID) ARPGX2
Influenza A by PCR: NEGATIVE
Influenza B by PCR: NEGATIVE
SARS Coronavirus 2 by RT PCR: NEGATIVE

## 2020-09-03 LAB — COMPREHENSIVE METABOLIC PANEL
ALT: 17 U/L (ref 0–44)
AST: 22 U/L (ref 15–41)
Albumin: 4.3 g/dL (ref 3.5–5.0)
Alkaline Phosphatase: 69 U/L (ref 38–126)
Anion gap: 15 (ref 5–15)
BUN: 10 mg/dL (ref 6–20)
CO2: 20 mmol/L — ABNORMAL LOW (ref 22–32)
Calcium: 9.5 mg/dL (ref 8.9–10.3)
Chloride: 98 mmol/L (ref 98–111)
Creatinine, Ser: 0.96 mg/dL (ref 0.61–1.24)
GFR, Estimated: >60 mL/min (ref >60)
Glucose, Bld: 90 mg/dL (ref 70–99)
Potassium: 3.2 mmol/L — ABNORMAL LOW (ref 3.5–5.1)
Sodium: 133 mmol/L — ABNORMAL LOW (ref 135–145)
Total Bilirubin: 0.7 mg/dL (ref 0.3–1.2)
Total Protein: 7 g/dL (ref 6.5–8.1)

## 2020-09-03 LAB — CBC WITH DIFFERENTIAL/PLATELET
Abs Immature Granulocytes: 0.06 10*3/uL (ref 0.00–0.07)
Basophils Absolute: 0 10*3/uL (ref 0.0–0.1)
Basophils Relative: 0 %
Eosinophils Absolute: 0 10*3/uL (ref 0.0–0.5)
Eosinophils Relative: 0 %
HCT: 39.5 % (ref 39.0–52.0)
Hemoglobin: 13.4 g/dL (ref 13.0–17.0)
Immature Granulocytes: 1 %
Lymphocytes Relative: 17 %
Lymphs Abs: 1.2 10*3/uL (ref 0.7–4.0)
MCH: 31.8 pg (ref 26.0–34.0)
MCHC: 33.9 g/dL (ref 30.0–36.0)
MCV: 93.8 fL (ref 80.0–100.0)
Monocytes Absolute: 0.4 10*3/uL (ref 0.1–1.0)
Monocytes Relative: 5 %
Neutro Abs: 5.1 10*3/uL (ref 1.7–7.7)
Neutrophils Relative %: 77 %
Platelets: 241 10*3/uL (ref 150–400)
RBC: 4.21 MIL/uL — ABNORMAL LOW (ref 4.22–5.81)
RDW: 13.4 % (ref 11.5–15.5)
WBC: 6.7 10*3/uL (ref 4.0–10.5)
nRBC: 0 % (ref 0.0–0.2)

## 2020-09-03 LAB — LIPID PANEL
Cholesterol: 168 mg/dL (ref 0–200)
HDL: 71 mg/dL (ref >40)
LDL Cholesterol: 86 mg/dL (ref 0–99)
Total CHOL/HDL Ratio: 2.4 RATIO
Triglycerides: 54 mg/dL (ref <150)
VLDL: 11 mg/dL (ref 0–40)

## 2020-09-03 LAB — VALPROIC ACID LEVEL: Valproic Acid Lvl: <10 ug/mL — ABNORMAL LOW (ref 50.0–100.0)

## 2020-09-03 LAB — TSH: TSH: 1.487 u[IU]/mL (ref 0.350–4.500)

## 2020-09-03 LAB — HEMOGLOBIN A1C
Hgb A1c MFr Bld: 4.9 % (ref 4.8–5.6)
Mean Plasma Glucose: 93.93 mg/dL

## 2020-09-03 LAB — ETHANOL: Alcohol, Ethyl (B): <10 mg/dL (ref <10)

## 2020-09-03 MED ORDER — DIVALPROEX SODIUM 250 MG PO DR TAB: 250.0000 mg | DELAYED_RELEASE_TABLET | Freq: Two times a day (BID) | ORAL | Status: AC

## 2020-09-03 MED ORDER — POTASSIUM CHLORIDE CRYS ER 20 MEQ PO TBCR
40.0000 meq | EXTENDED_RELEASE_TABLET | Freq: Once | ORAL | Status: AC
  Administered 2020-09-03: 40 meq via ORAL
  Filled 2020-09-03: qty 2

## 2020-09-03 MED ORDER — BENZTROPINE MESYLATE 1 MG PO TABS: 1.0000 mg | ORAL_TABLET | Freq: Two times a day (BID) | ORAL | 0 refills | Status: AC | PRN

## 2020-09-03 NOTE — Progress Notes (Signed)
CSW met wit patient bedside IOT provide patient with contact information for [facility] for [facility] (patient's home of record). Patient acknowledged.   Signed:  Durenda Hurt, MSW, [place], LCASA 09/03/2020 11:38 AM
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
## 2020-09-03 NOTE — ED Notes (Signed)
Pt sleeping at present, no distress noted, monitoring for safety. 

## 2020-09-03 NOTE — Progress Notes (Signed)
Patient alert and oriented X 3, denies SI, HI this morning. Pleasant in mood, appropriate on unit with peers and staff. Patient able to take scheduled medication this morning. Offered breakfast. Patient talking on the telephone at this time. Nursing staff will continue to monitor.

## 2020-09-03 NOTE — Discharge Planning (Signed)
Hassan Buckler to be D/C'd Home per MD order.  Discussed with the patient and all questions fully answered.   An After Visit Summary was printed and given to the patient. Patient received prescriptions and sample medications  D/c education completed with patient including a list of resources in [place]. patient able to verbalize understanding, all questions fully answered.   Patient instructed to return to ED, call 911, or call MD for any changes in condition.   Patient escorted via WC, and D/C home via private auto.  Leamon Arnt 09/03/2020 11:18 AM

## 2020-09-10 ENCOUNTER — Telehealth (HOSPITAL_COMMUNITY): Payer: Self-pay | Admitting: General Practice

## 2020-09-10 NOTE — Telephone Encounter (Signed)
Care Management - Follow Up [facility] Discharges   Writer attempted to make contact with patient today and was unsuccessful.  Patient mother reports that the patient was currently inpatient at another psychiatric facility.

## 2020-11-01 IMAGING — DX DG PELVIS 3+V JUDET
3 series · 3 of 3 positions shown · non-contrast
Comparison: 12/12/2019

CLINICAL DATA: Right hip pain

EXAM:
JUDET PELVIS - 3+ VIEW

[pelvis ap]
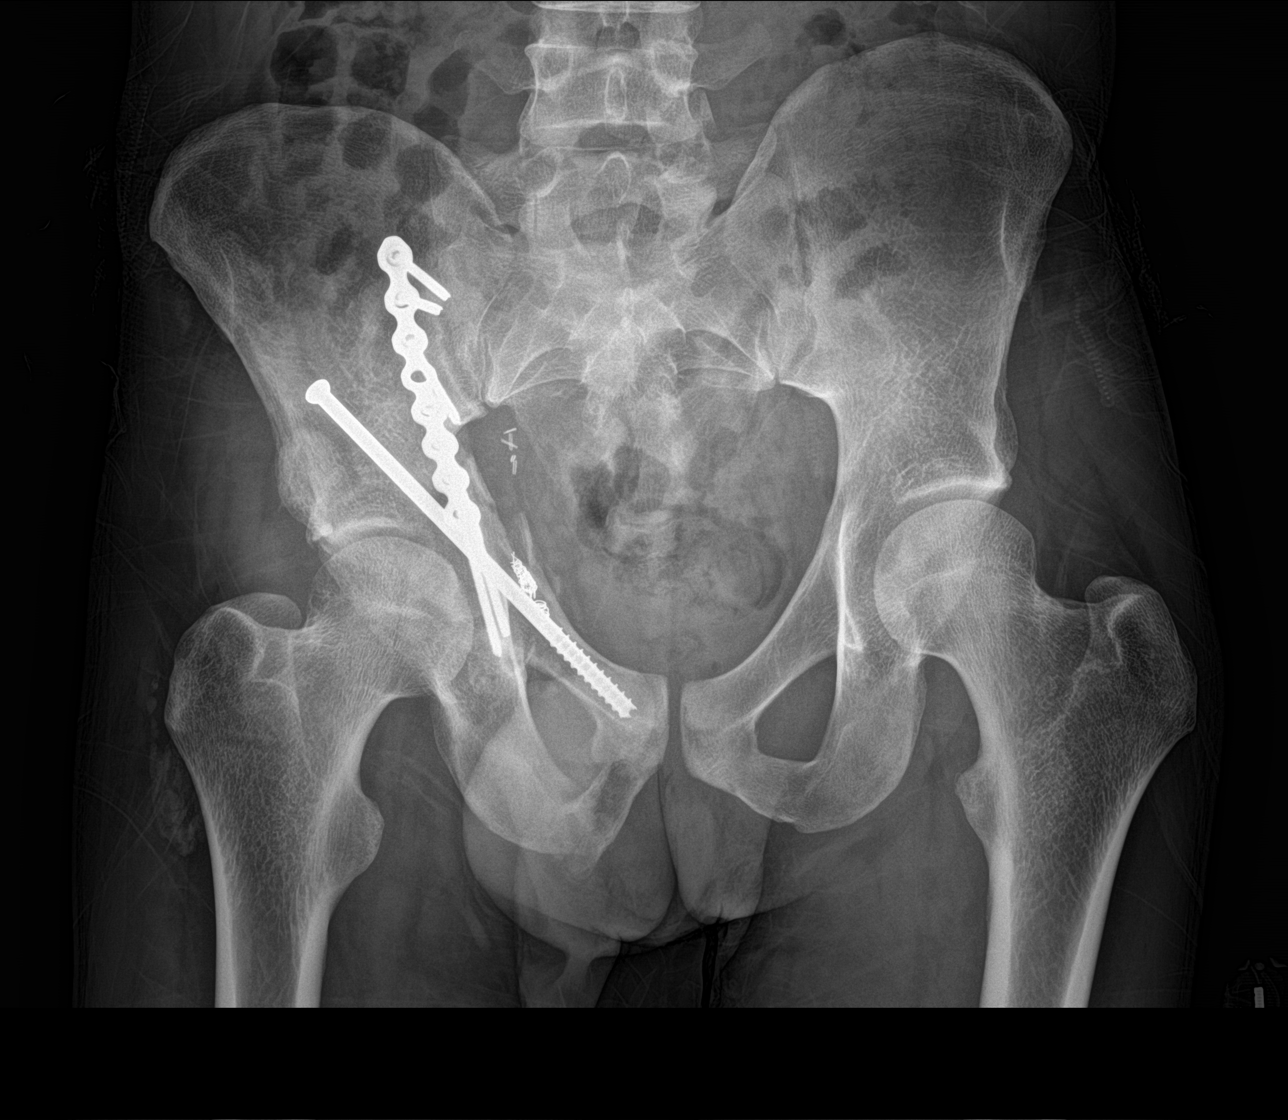

[pelvis obl (1 of 2)]
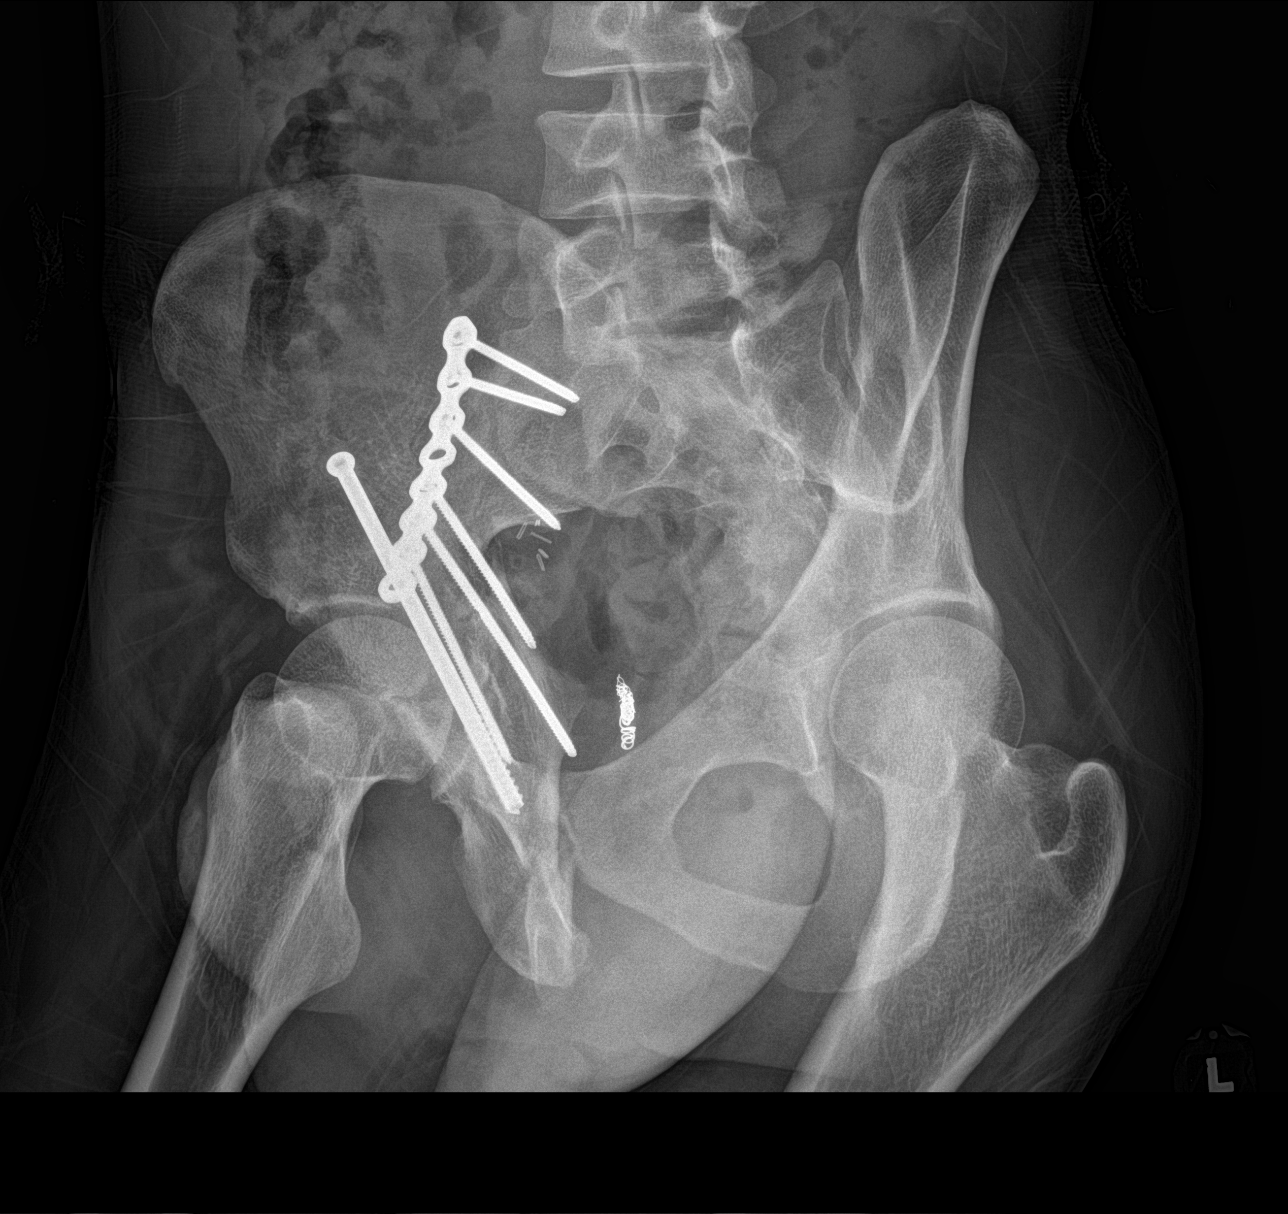

[pelvis obl (2 of 2)]
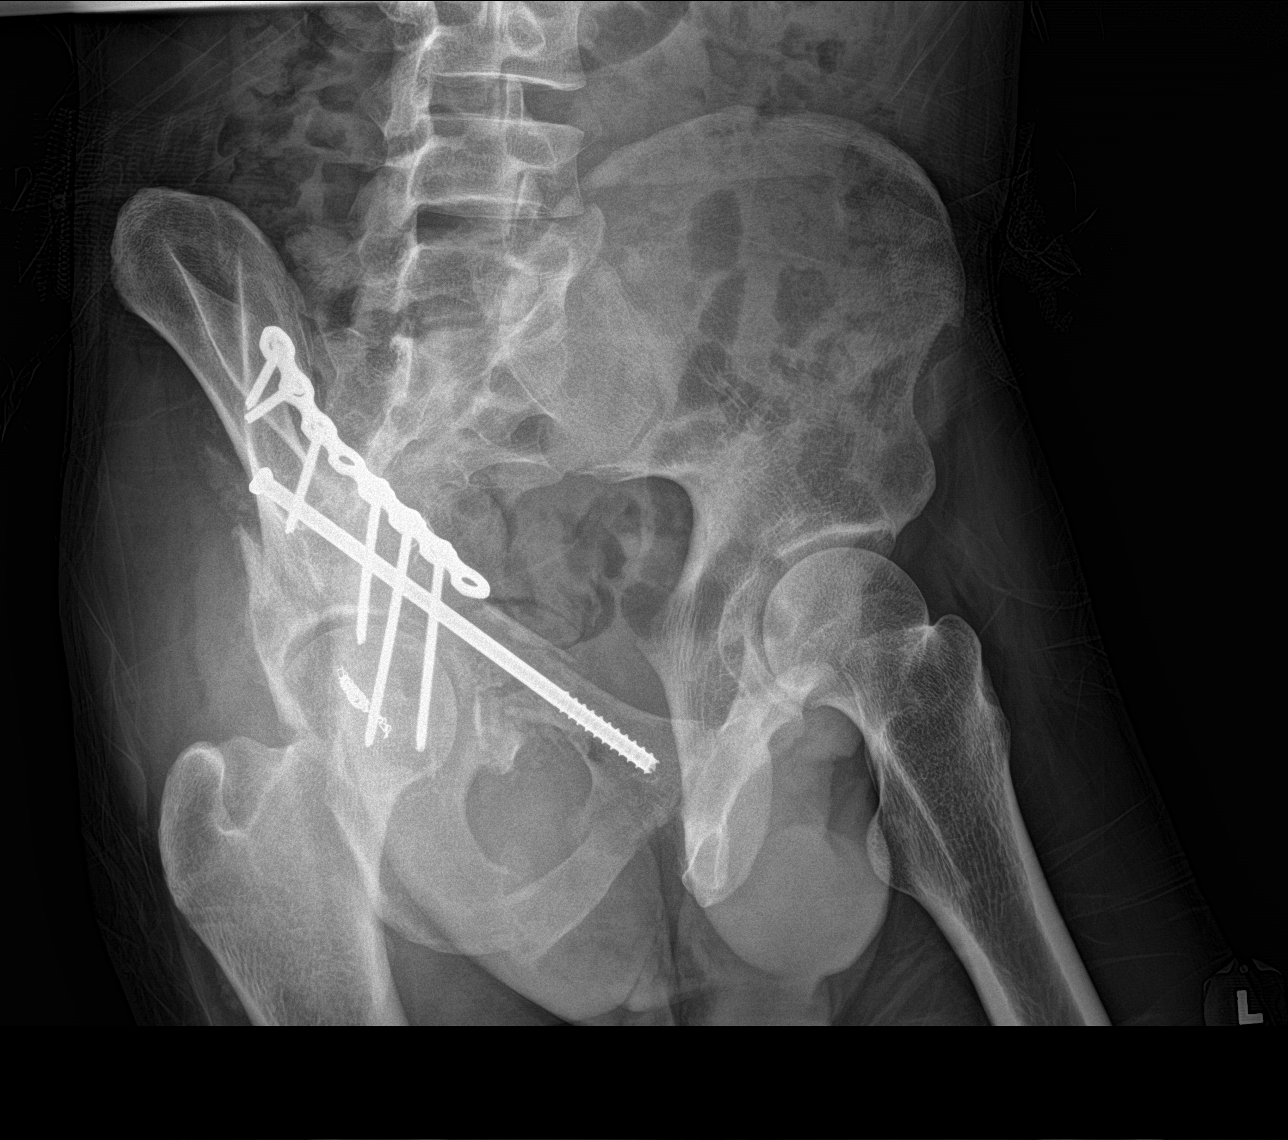

[3 of 3 positions shown; findings below may reference images not displayed]

FINDINGS: Postsurgical changes are again noted on the right stable in
appearance from the prior exam. Stable right inferior pubic ramus
fracture is noted. No new abnormality is seen.
IMPRESSION: Stable right inferior pubic ramus fracture stable postoperative and
posttraumatic changes. No acute abnormality is noted.

## 2020-11-08 IMAGING — CT CT TIBIA FIBULA *L* W/ CM
2 of 3 series · 10 of 35 positions shown, 12 images · IV contrast (OMNIPAQUE 300)
Comparison: X-ray 12/03/2019, 12/18/2019

CONTRAST:  100mL OMNIPAQUE IOHEXOL 300 MG/ML  SOLN

CLINICAL DATA: Right ankle in calf pain and swelling. Fever.
Previous tibial fixation

EXAM:
CT OF THE LOWER RIGHT EXTREMITY WITH CONTRAST
TECHNIQUE: Multidetector CT imaging of the lower right extremity was performed
according to the standard protocol following intravenous contrast
administration.

[Series 4: axial st · axial · 0.50mm/px · z∈[-1506,-914]mm · 7 of 350 slices shown, 9 images]
[im 27/350  soft-tissue]
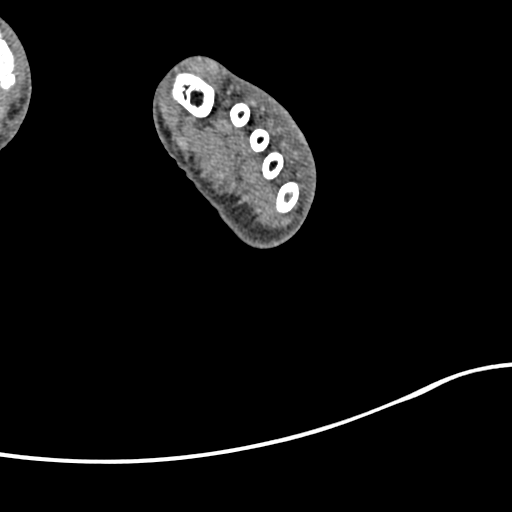
[im 27/350  bone]
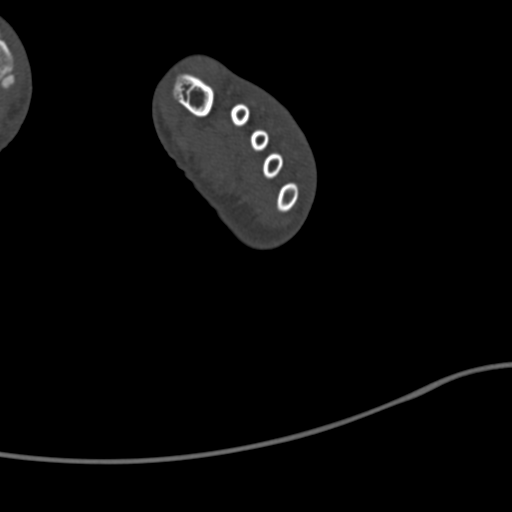
[im 81/350  bone]
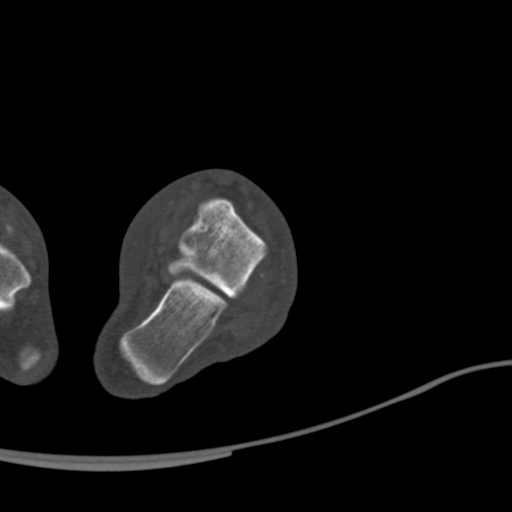
[im 135/350  bone]
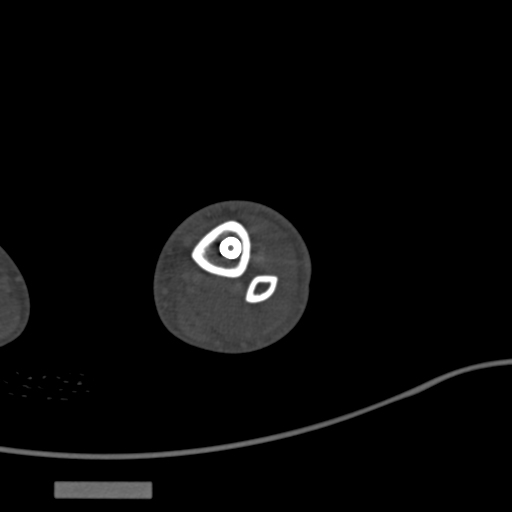
[im 188/350  bone]
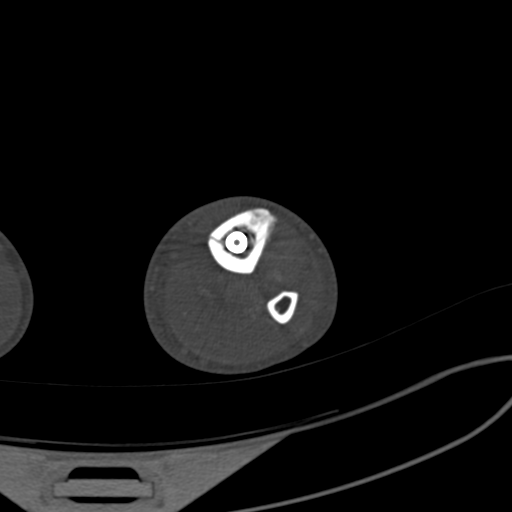
[im 215/350  soft-tissue]
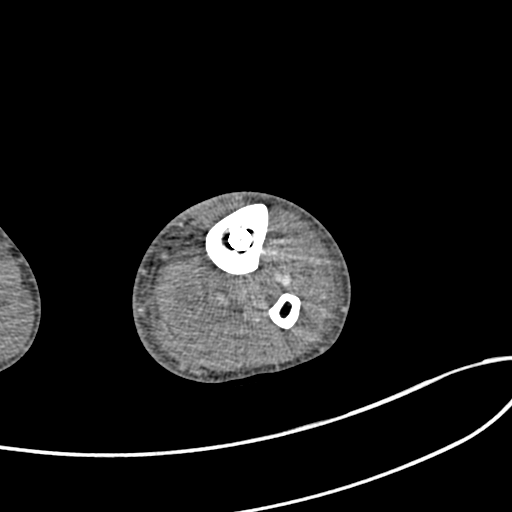
[im 215/350  bone]
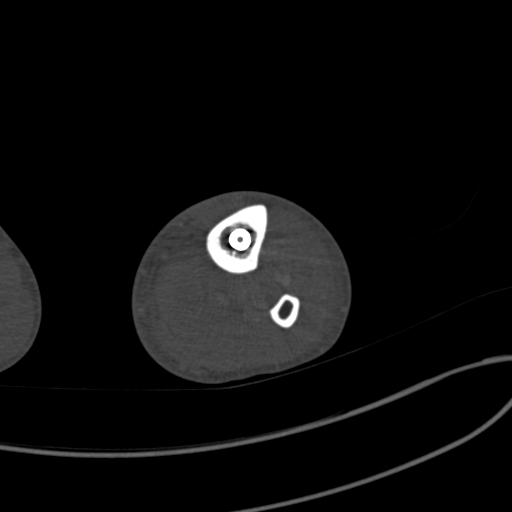
[im 269/350  bone]
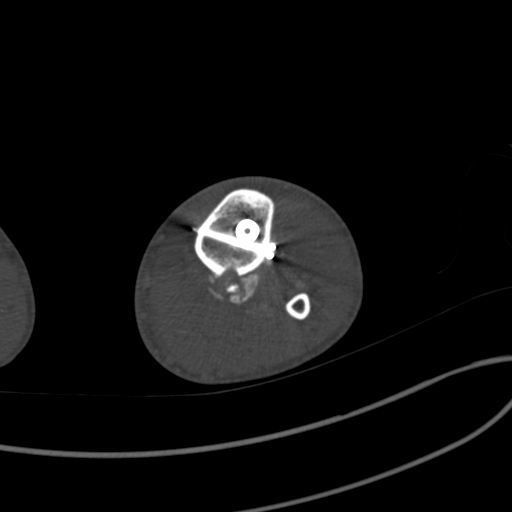
[im 323/350  bone]
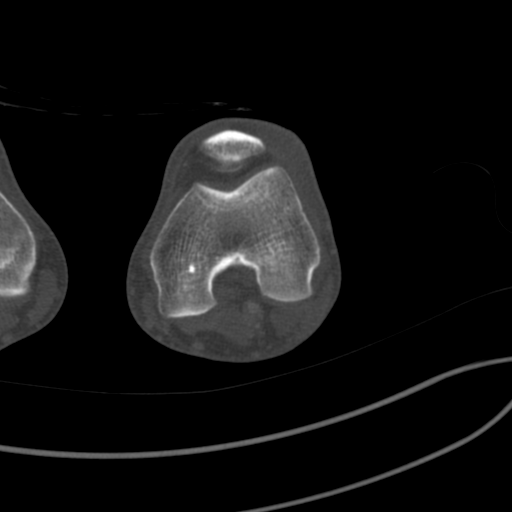

[Series 9: coronal st · coronal · 0.46mm/px · 3 of 111 slices shown]
[im 23/111  bone]
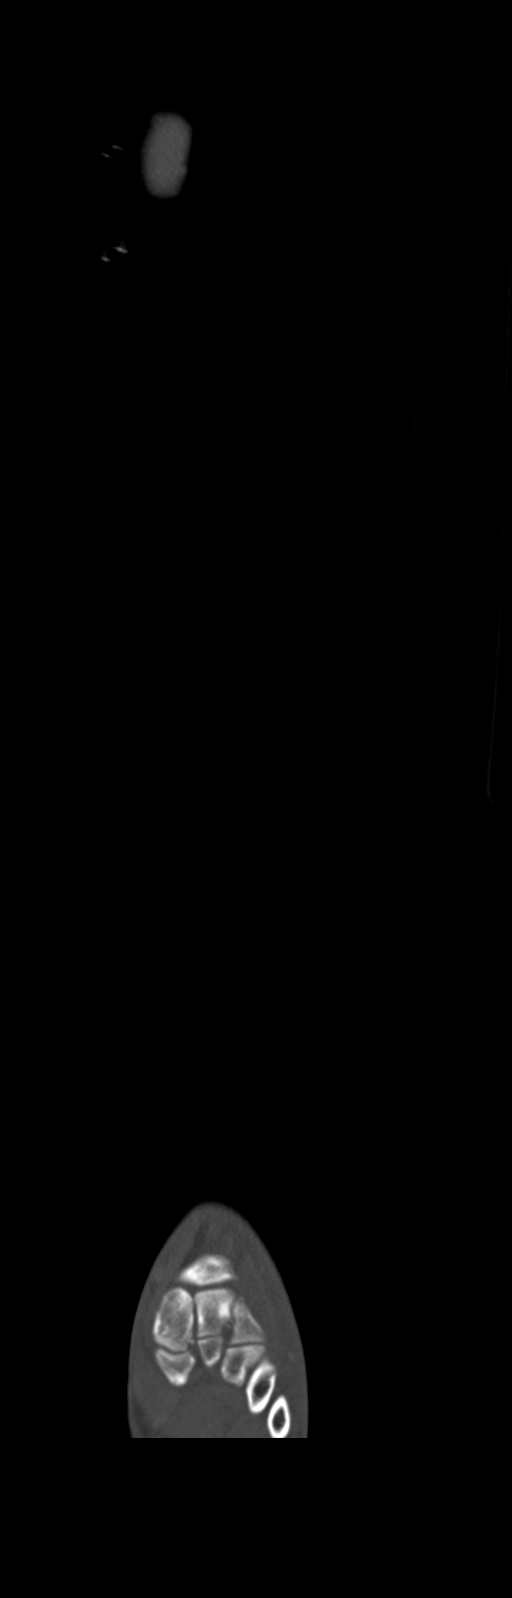
[im 45/111  bone]
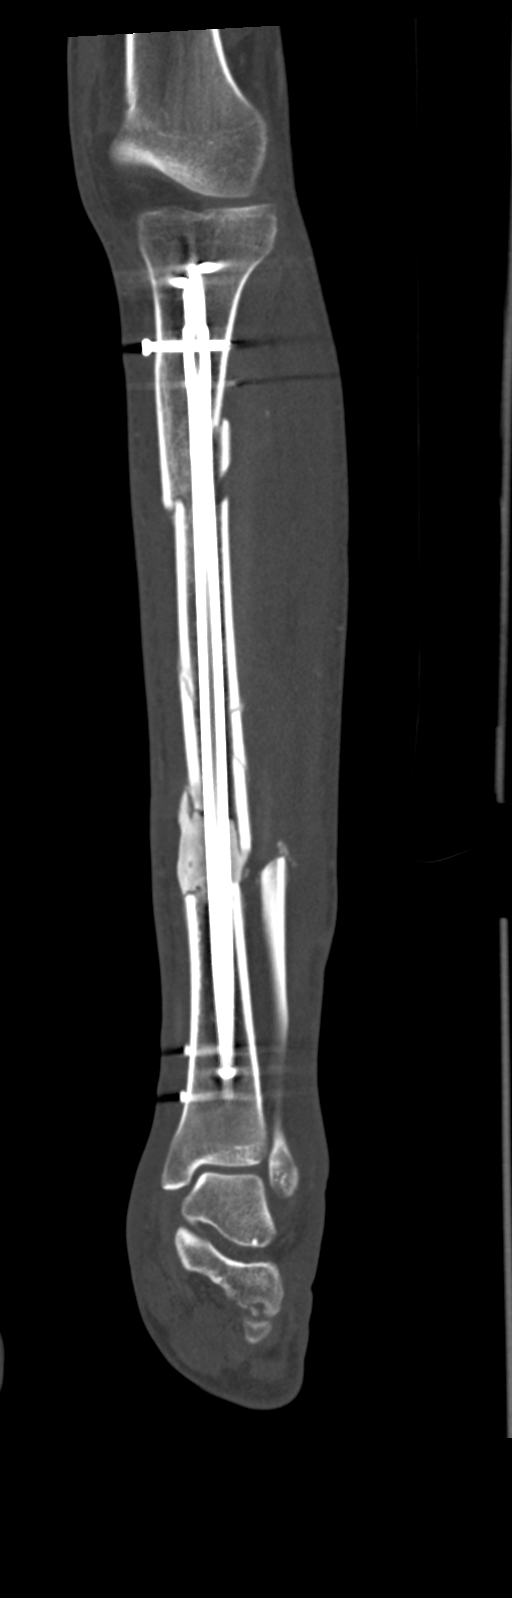
[im 67/111  bone]
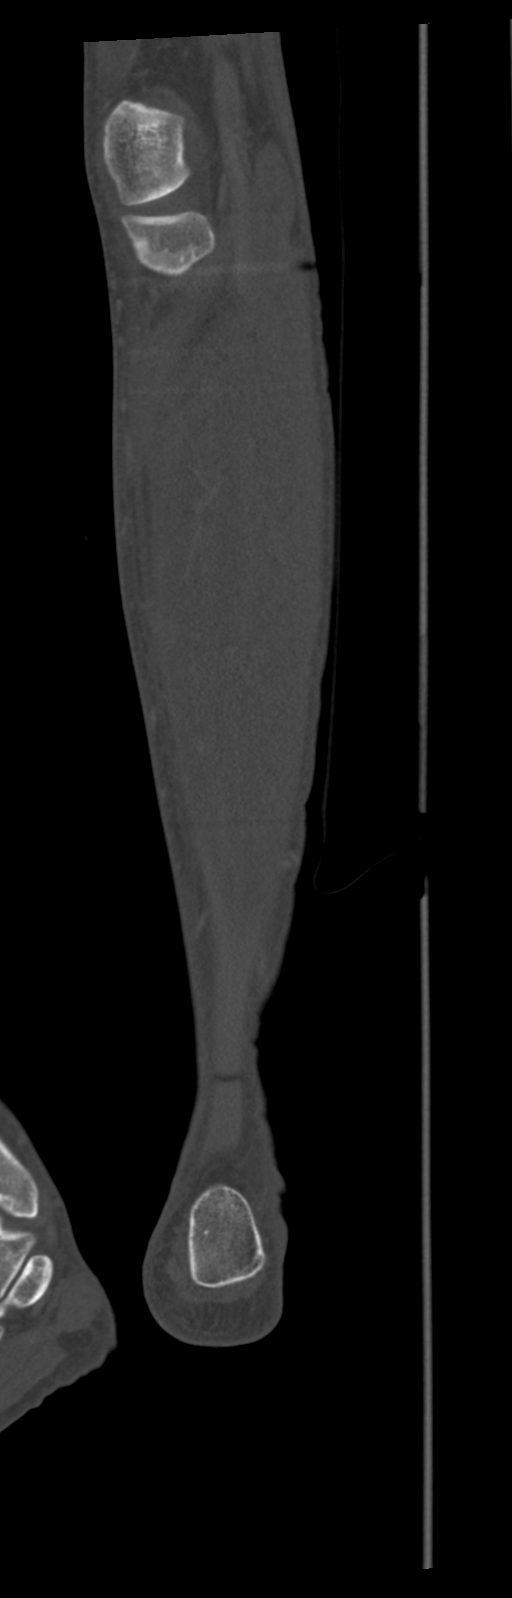

[10 of 35 positions shown; findings below may reference images not displayed]

FINDINGS: Bones/Joint/Cartilage

Redemonstration of comminuted proximal and distal tibial diaphyseal
fractures status post ORIF with long intramedullary rod and proximal
and distal interlocking screws. Bone graft material is noted at the
mid to distal tibial diaphysis. There is periosteal new bone
formation associated with the fracture sites, most prevalent at the
proximal fracture location. Hardware appears intact. Osseous
alignment is unchanged and remains near anatomic. No new fracture.
No areas of cortical destruction are identified.

Comminuted, mildly displaced mid to distal fibular diaphyseal
fracture with 5.0 cm butterfly fragment which is slightly
posteriorly displaced. There is also periosteal new bone formation
at the fibular fracture site. No change in alignment from previous
radiographs. Small amount of periosteal new bone formation along the
anteromedial margin of the fibular head, which may represent a
healing nondisplaced fracture or mechanical irritation from
proximal-most tibial screw (series 3, image 56). No areas of
cortical destruction are identified.

Alignment at the knee and ankle are maintained. Joint spaces are
preserved. No tibiotalar joint effusion or ankle joint effusion.

Ligaments

Suboptimally assessed by CT.

Muscles and Tendons

No well-defined intramuscular fluid collection. The tendinous
structures appear intact within the limitations of CT.

Soft tissues

There is circumferential soft tissue edema at the level of the
distal lower leg and ankle. No well-defined fluid collection. No
soft tissue gas. No deep soft tissue ulceration.
IMPRESSION: 1. Circumferential soft tissue edema at the level of the distal
lower leg and ankle. No well-defined fluid collection or soft tissue
gas.
2. No evidence to suggest osteomyelitis.
3. Redemonstration of comminuted proximal and distal tibial
diaphyseal fractures status post ORIF with long intramedullary rod
and proximal and distal interlocking screws. Hardware appears
intact. Osseous alignment is unchanged and remains near-anatomic.
4. Small amount of periosteal new bone formation along the
anteromedial margin of the fibular head, which may represent a
healing nondisplaced fracture or mechanical irritation from the
proximal-most tibial screw.

## 2021-01-14 IMAGING — RF DG TIBIA/FIBULA 2V*L*
1 series · 6 of 6 positions shown · IV contrast (agent unspecified)
Comparison: None.

CLINICAL DATA: Open reduction and internal fixation of left tibial
nonunion.

EXAM:
DG C-ARM 1-60 MIN; LEFT TIBIA AND FIBULA - 2 VIEW
CONTRAST:  December 18, 2019.
FLUOROSCOPY TIME:  Radiation Exposure Index (if provided by the
fluoroscopic device): 2.15 mGy.

[Series 1: run · 6 of 6 slices shown]
[im 1/6]
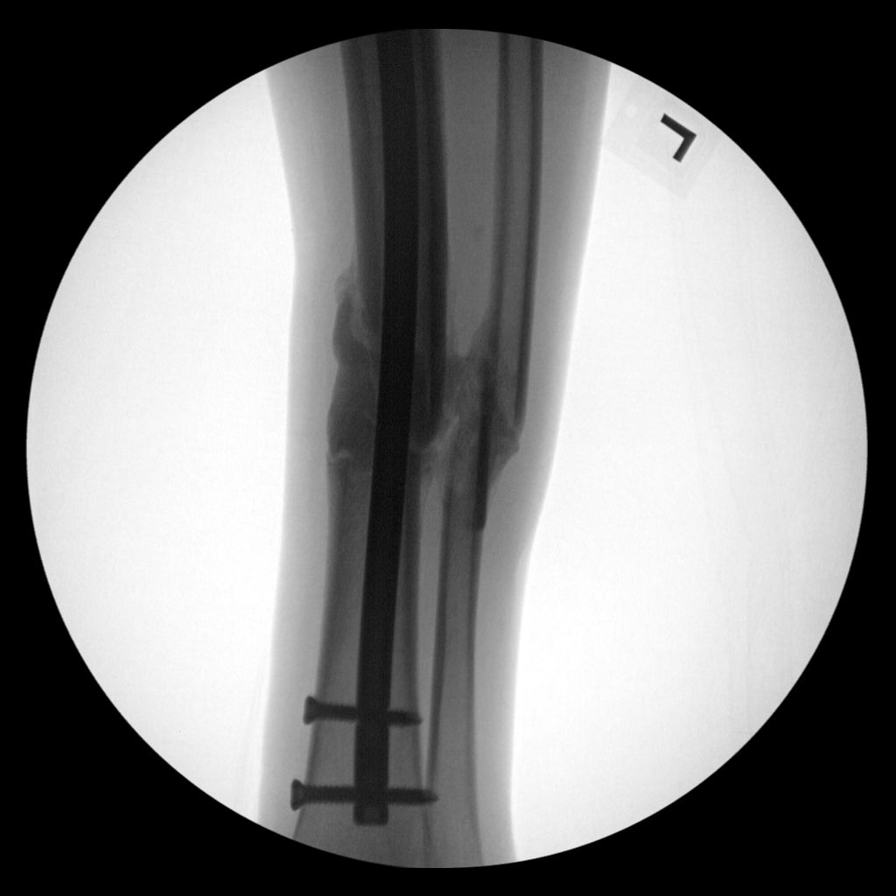
[im 2/6]
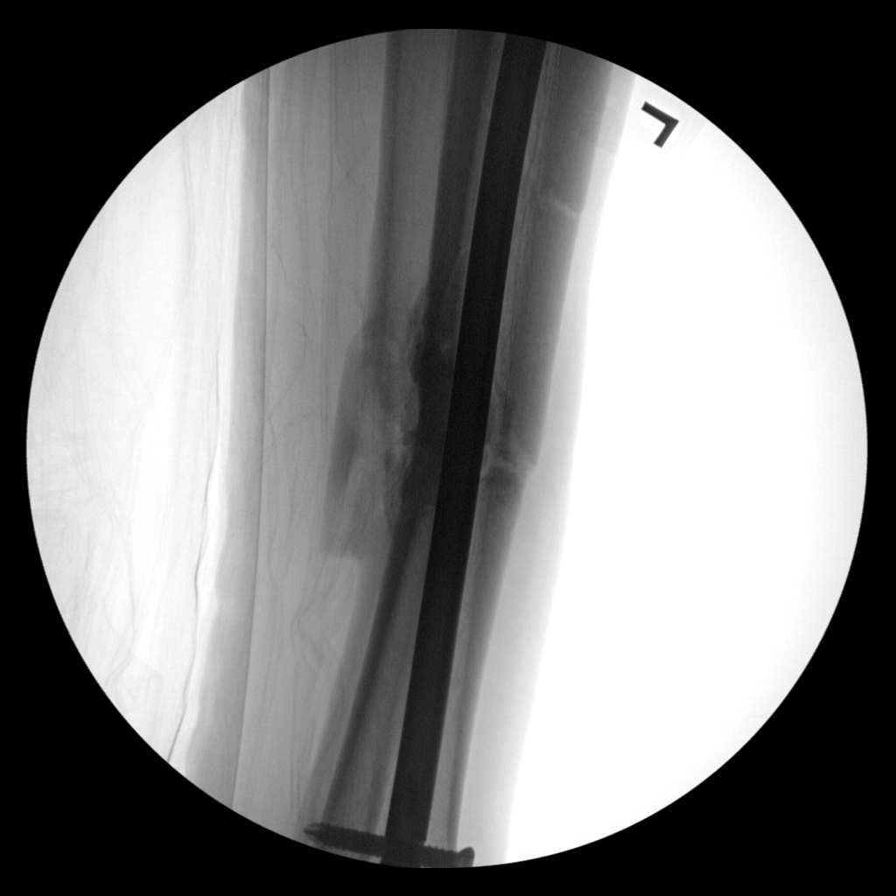
[im 3/6]
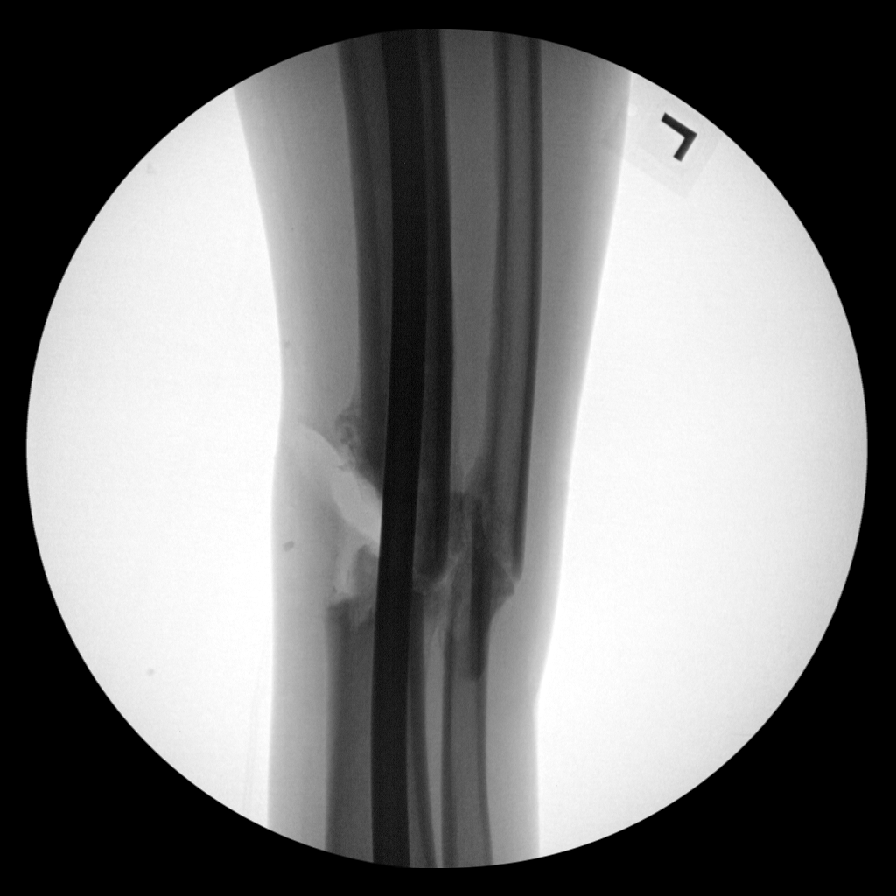
[im 4/6]
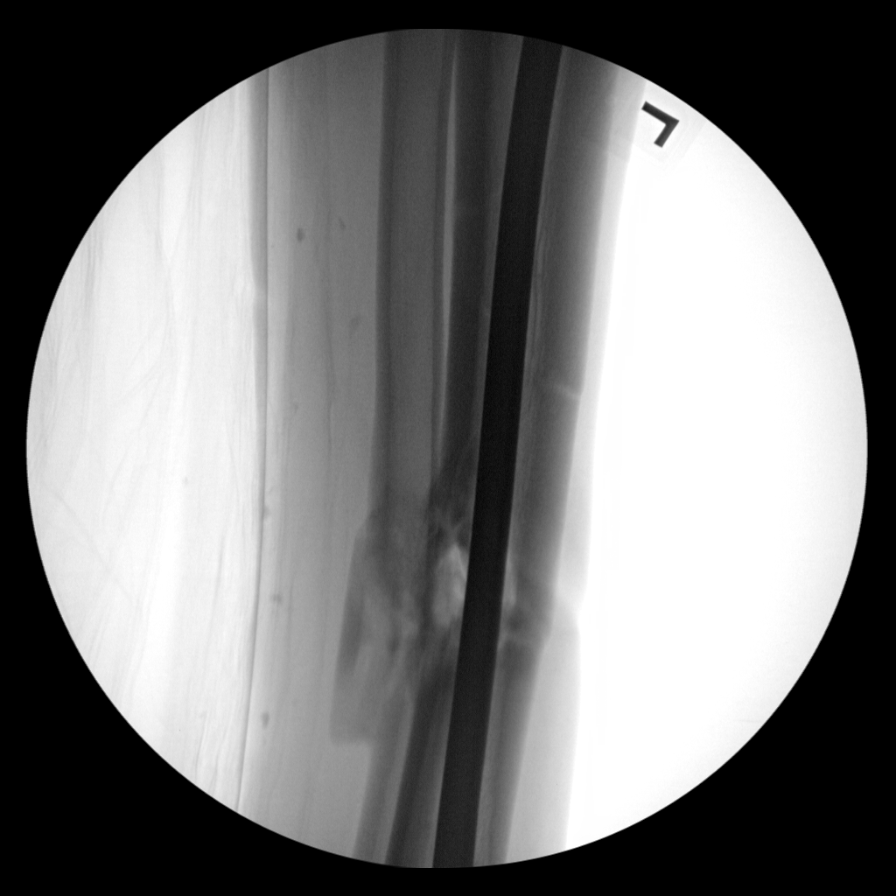
[im 5/6]
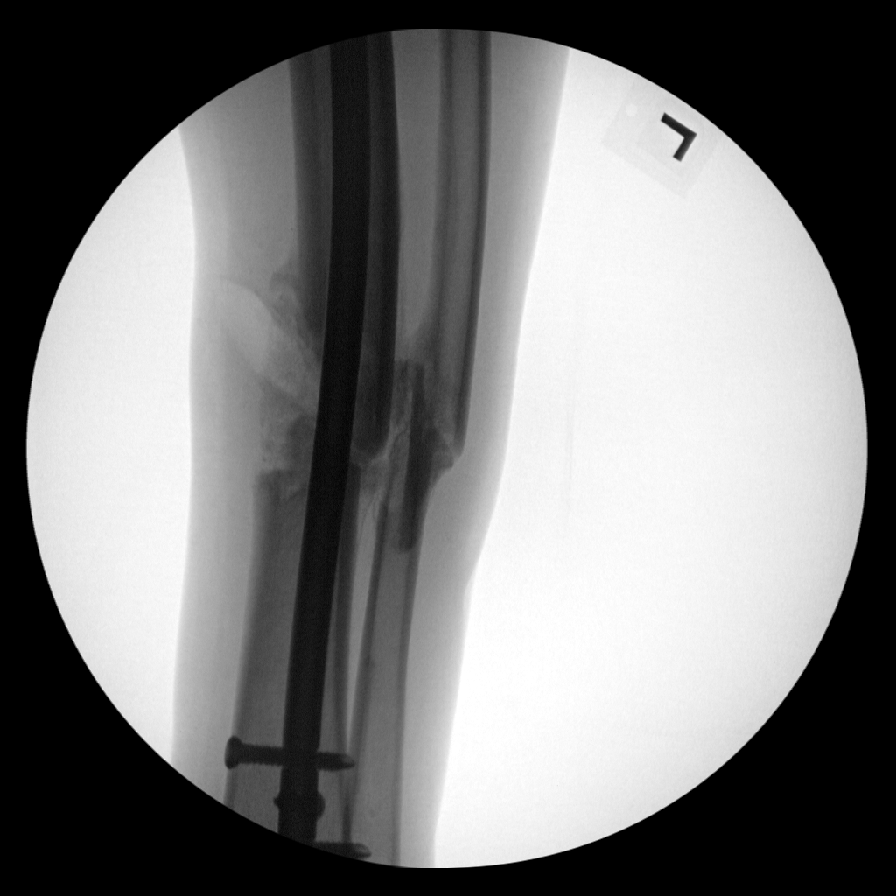
[im 6/6]
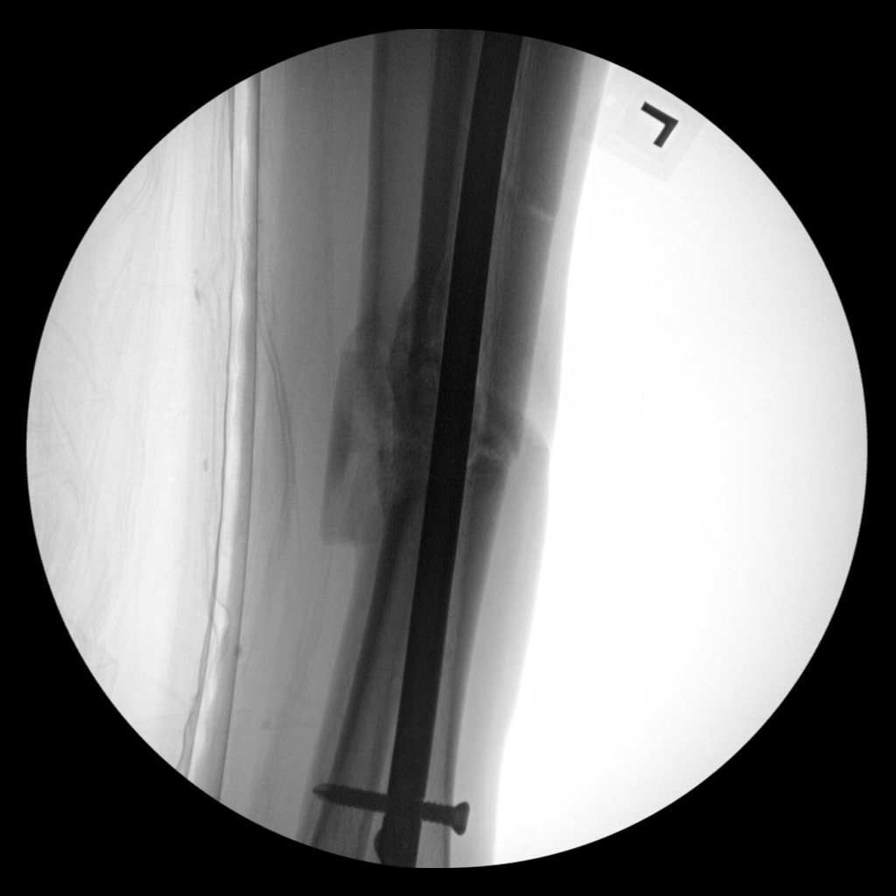

[6 of 6 positions shown; findings below may reference images not displayed]

FINDINGS: Six intraoperative fluoroscopic images were obtained of the left
tibia and fibula. These images demonstrate the patient be status
post intramedullary rod fixation of tibial fracture.
IMPRESSION: Fluoroscopic guidance provided during left leg surgery.
